# Patient Record
Sex: Female | Born: 1955 | Race: White | Hispanic: No | Smoking: Current every day smoker
Health system: Southern US, Community
[De-identification: ages and names within clinical notes are randomized; demographics above are authoritative.]

## PROBLEM LIST (undated history)

## (undated) DIAGNOSIS — R000 Tachycardia, unspecified: Secondary | ICD-10-CM

## (undated) DIAGNOSIS — C801 Malignant (primary) neoplasm, unspecified: Secondary | ICD-10-CM

## (undated) DIAGNOSIS — I10 Essential (primary) hypertension: Secondary | ICD-10-CM

## (undated) DIAGNOSIS — C569 Malignant neoplasm of unspecified ovary: Secondary | ICD-10-CM

## (undated) HISTORY — PX: ABDOMINAL HYSTERECTOMY: SHX81

---

## 2013-05-12 ENCOUNTER — Emergency Department (HOSPITAL_BASED_OUTPATIENT_CLINIC_OR_DEPARTMENT_OTHER): Payer: PRIVATE HEALTH INSURANCE

## 2013-05-12 ENCOUNTER — Observation Stay (HOSPITAL_BASED_OUTPATIENT_CLINIC_OR_DEPARTMENT_OTHER)
Admission: EM | Admit: 2013-05-12 | Discharge: 2013-05-15 | Disposition: A | Discharge status: Home / Self Care | Payer: PRIVATE HEALTH INSURANCE | Attending: Orthopedic Surgery | Admitting: Orthopedic Surgery

## 2013-05-12 ENCOUNTER — Encounter (HOSPITAL_COMMUNITY)
Admission: EM | Disposition: A | Discharge status: Home / Self Care | Payer: Self-pay | Source: Home / Self Care | Attending: Orthopedic Surgery

## 2013-05-12 ENCOUNTER — Encounter (HOSPITAL_BASED_OUTPATIENT_CLINIC_OR_DEPARTMENT_OTHER): Payer: Self-pay | Admitting: Emergency Medicine

## 2013-05-12 DIAGNOSIS — Z90710 Acquired absence of both cervix and uterus: Secondary | ICD-10-CM | POA: Insufficient documentation

## 2013-05-12 DIAGNOSIS — I10 Essential (primary) hypertension: Secondary | ICD-10-CM | POA: Insufficient documentation

## 2013-05-12 DIAGNOSIS — F17200 Nicotine dependence, unspecified, uncomplicated: Secondary | ICD-10-CM | POA: Insufficient documentation

## 2013-05-12 DIAGNOSIS — E119 Type 2 diabetes mellitus without complications: Secondary | ICD-10-CM | POA: Insufficient documentation

## 2013-05-12 DIAGNOSIS — I498 Other specified cardiac arrhythmias: Secondary | ICD-10-CM | POA: Insufficient documentation

## 2013-05-12 DIAGNOSIS — S42202A Unspecified fracture of upper end of left humerus, initial encounter for closed fracture: Secondary | ICD-10-CM | POA: Diagnosis present

## 2013-05-12 DIAGNOSIS — Z8543 Personal history of malignant neoplasm of ovary: Secondary | ICD-10-CM | POA: Insufficient documentation

## 2013-05-12 DIAGNOSIS — S0180XA Unspecified open wound of other part of head, initial encounter: Secondary | ICD-10-CM | POA: Insufficient documentation

## 2013-05-12 DIAGNOSIS — W010XXA Fall on same level from slipping, tripping and stumbling without subsequent striking against object, initial encounter: Secondary | ICD-10-CM | POA: Insufficient documentation

## 2013-05-12 DIAGNOSIS — S42213A Unspecified displaced fracture of surgical neck of unspecified humerus, initial encounter for closed fracture: Principal | ICD-10-CM | POA: Insufficient documentation

## 2013-05-12 DIAGNOSIS — E876 Hypokalemia: Secondary | ICD-10-CM | POA: Insufficient documentation

## 2013-05-12 DIAGNOSIS — S0181XA Laceration without foreign body of other part of head, initial encounter: Secondary | ICD-10-CM

## 2013-05-12 DIAGNOSIS — Y92009 Unspecified place in unspecified non-institutional (private) residence as the place of occurrence of the external cause: Secondary | ICD-10-CM | POA: Insufficient documentation

## 2013-05-12 HISTORY — DX: Tachycardia, unspecified: R00.0

## 2013-05-12 HISTORY — DX: Malignant neoplasm of unspecified ovary: C56.9

## 2013-05-12 HISTORY — DX: Malignant (primary) neoplasm, unspecified: C80.1

## 2013-05-12 HISTORY — DX: Essential (primary) hypertension: I10

## 2013-05-12 LAB — CBC WITH DIFFERENTIAL/PLATELET
Basophils Absolute: 0.1 10*3/uL (ref 0.0–0.1)
Basophils Relative: 0 % (ref 0–1)
Eosinophils Absolute: 0.1 10*3/uL (ref 0.0–0.7)
Eosinophils Relative: 0 % (ref 0–5)
HCT: 42.6 % (ref 36.0–46.0)
Hemoglobin: 14.6 g/dL (ref 12.0–15.0)
LYMPHS PCT: 11 % — AB (ref 12–46)
Lymphs Abs: 2.1 10*3/uL (ref 0.7–4.0)
MCH: 33 pg (ref 26.0–34.0)
MCHC: 34.3 g/dL (ref 30.0–36.0)
MCV: 96.2 fL (ref 78.0–100.0)
Monocytes Absolute: 0.9 10*3/uL (ref 0.1–1.0)
Monocytes Relative: 5 % (ref 3–12)
Neutro Abs: 15.3 10*3/uL — ABNORMAL HIGH (ref 1.7–7.7)
Neutrophils Relative %: 84 % — ABNORMAL HIGH (ref 43–77)
PLATELETS: 413 10*3/uL — AB (ref 150–400)
RBC: 4.43 MIL/uL (ref 3.87–5.11)
RDW: 14.5 % (ref 11.5–15.5)
WBC: 18.4 10*3/uL — AB (ref 4.0–10.5)

## 2013-05-12 LAB — BASIC METABOLIC PANEL
BUN: 10 mg/dL (ref 6–23)
CHLORIDE: 97 meq/L (ref 96–112)
CO2: 24 mEq/L (ref 19–32)
Calcium: 9.4 mg/dL (ref 8.4–10.5)
Creatinine, Ser: 0.7 mg/dL (ref 0.50–1.10)
GFR calc Af Amer: >90 mL/min (ref >90)
GFR calc non Af Amer: >90 mL/min (ref >90)
Glucose, Bld: 177 mg/dL — ABNORMAL HIGH (ref 70–99)
Potassium: 3.2 mEq/L — ABNORMAL LOW (ref 3.7–5.3)
SODIUM: 139 meq/L (ref 137–147)

## 2013-05-12 LAB — PROTIME-INR
INR: 0.91 (ref 0.00–1.49)
Prothrombin Time: 12.1 seconds (ref 11.6–15.2)

## 2013-05-12 LAB — GLUCOSE, CAPILLARY: Glucose-Capillary: 158 mg/dL — ABNORMAL HIGH (ref 70–99)

## 2013-05-12 SURGERY — OPEN REDUCTION INTERNAL FIXATION (ORIF) PROXIMAL HUMERUS FRACTURE
Anesthesia: General | Laterality: Left

## 2013-05-12 MED ORDER — ONDANSETRON HCL 4 MG PO TABS
4.0000 mg | ORAL_TABLET | Freq: Four times a day (QID) | ORAL | Status: DC | PRN
Start: 2013-05-12 — End: 2013-05-13

## 2013-05-12 MED ORDER — HYDROMORPHONE HCL PF 1 MG/ML IJ SOLN
0.5000 mg | INTRAMUSCULAR | Status: DC | PRN
  Administered 2013-05-12: 0.5 mg via INTRAVENOUS
  Filled 2013-05-12 (×3): qty 1

## 2013-05-12 MED ORDER — HYDROMORPHONE HCL PF 1 MG/ML IJ SOLN
0.5000 mg | INTRAMUSCULAR | Status: AC | PRN
  Administered 2013-05-12 (×3): 0.5 mg via INTRAVENOUS
  Filled 2013-05-12 (×3): qty 1

## 2013-05-12 MED ORDER — METHOCARBAMOL 1000 MG/10ML IJ SOLN: 500.0000 mg | Freq: Four times a day (QID) | INTRAVENOUS | Status: DC | PRN

## 2013-05-12 MED ORDER — DOCUSATE SODIUM 100 MG PO CAPS
100.0000 mg | ORAL_CAPSULE | Freq: Two times a day (BID) | ORAL | Status: DC
  Administered 2013-05-12: 100 mg via ORAL
  Filled 2013-05-12 (×3): qty 1

## 2013-05-12 MED ORDER — OXYCODONE-ACETAMINOPHEN 5-325 MG PO TABS: 1.0000 | ORAL_TABLET | ORAL | Status: DC | PRN

## 2013-05-12 MED ORDER — HYDROMORPHONE HCL PF 1 MG/ML IJ SOLN
0.5000 mg | INTRAMUSCULAR | Status: DC | PRN
  Administered 2013-05-12 – 2013-05-13 (×8): 1 mg via INTRAVENOUS
  Filled 2013-05-12 (×9): qty 1

## 2013-05-12 MED ORDER — METHOCARBAMOL 500 MG PO TABS
500.0000 mg | ORAL_TABLET | Freq: Four times a day (QID) | ORAL | Status: DC | PRN
  Administered 2013-05-13: 500 mg via ORAL

## 2013-05-12 MED ORDER — POTASSIUM CHLORIDE IN NACL 20-0.45 MEQ/L-% IV SOLN
INTRAVENOUS | Status: DC
  Administered 2013-05-12: 23:00:00 via INTRAVENOUS
  Filled 2013-05-12 (×3): qty 1000

## 2013-05-12 MED ORDER — KCL IN DEXTROSE-NACL 20-5-0.45 MEQ/L-%-% IV SOLN
Freq: Once | INTRAVENOUS | Status: AC
  Administered 2013-05-12: 18:00:00 via INTRAVENOUS
  Filled 2013-05-12: qty 1000

## 2013-05-12 MED ORDER — METOCLOPRAMIDE HCL 5 MG PO TABS: 5.0000 mg | ORAL_TABLET | Freq: Three times a day (TID) | ORAL | Status: DC | PRN

## 2013-05-12 MED ORDER — KCL IN DEXTROSE-NACL 30-5-0.45 MEQ/L-%-% IV SOLN
INTRAVENOUS | Status: DC
  Filled 2013-05-12: qty 1000

## 2013-05-12 MED ORDER — POLYETHYLENE GLYCOL 3350 17 G PO PACK
17.0000 g | PACK | Freq: Every day | ORAL | Status: DC | PRN
  Filled 2013-05-12: qty 1

## 2013-05-12 MED ORDER — DIPHENHYDRAMINE HCL 12.5 MG/5ML PO ELIX
12.5000 mg | ORAL_SOLUTION | ORAL | Status: DC | PRN
  Filled 2013-05-12: qty 10

## 2013-05-12 MED ORDER — SENNA 8.6 MG PO TABS
1.0000 | ORAL_TABLET | Freq: Two times a day (BID) | ORAL | Status: DC
  Administered 2013-05-12: 8.6 mg via ORAL
  Filled 2013-05-12 (×3): qty 1

## 2013-05-12 MED ORDER — HYDROMORPHONE HCL PF 1 MG/ML IJ SOLN: 1.0000 mg | INTRAMUSCULAR | Status: DC | PRN

## 2013-05-12 MED ORDER — HYDROMORPHONE HCL PF 1 MG/ML IJ SOLN
INTRAMUSCULAR | Status: AC
  Administered 2013-05-12: 1 mg
  Filled 2013-05-12: qty 1

## 2013-05-12 MED ORDER — PNEUMOCOCCAL VAC POLYVALENT 25 MCG/0.5ML IJ INJ
0.5000 mL | INJECTION | INTRAMUSCULAR | Status: DC
  Filled 2013-05-12: qty 0.5

## 2013-05-12 MED ORDER — ONDANSETRON HCL 4 MG/2ML IJ SOLN: 4.0000 mg | Freq: Four times a day (QID) | INTRAMUSCULAR | Status: DC | PRN

## 2013-05-12 MED ORDER — OXYCODONE HCL 5 MG PO TABS: 5.0000 mg | ORAL_TABLET | ORAL | Status: DC | PRN

## 2013-05-12 MED ORDER — BISACODYL 10 MG RE SUPP: 10.0000 mg | Freq: Every day | RECTAL | Status: DC | PRN

## 2013-05-12 MED ORDER — DEXTROSE-NACL 5-0.45 % IV SOLN: INTRAVENOUS | Status: DC

## 2013-05-12 MED ORDER — METOCLOPRAMIDE HCL 5 MG/ML IJ SOLN: 5.0000 mg | Freq: Three times a day (TID) | INTRAMUSCULAR | Status: DC | PRN

## 2013-05-12 SURGICAL SUPPLY — 42 items
BENZOIN TINCTURE PRP APPL 2/3 (GAUZE/BANDAGES/DRESSINGS) ×3 IMPLANT
BLADE 10 SAFETY STRL DISP (BLADE) ×3 IMPLANT
CLEANER TIP ELECTROSURG 2X2 (MISCELLANEOUS) IMPLANT
CLOSURE STERI-STRIP 1/2X4 (GAUZE/BANDAGES/DRESSINGS) ×1
CLSR STERI-STRIP ANTIMIC 1/2X4 (GAUZE/BANDAGES/DRESSINGS) ×2 IMPLANT
COVER SURGICAL LIGHT HANDLE (MISCELLANEOUS) ×3 IMPLANT
DRAPE C-ARM 42X72 X-RAY (DRAPES) ×3 IMPLANT
DRAPE INCISE IOBAN 66X45 STRL (DRAPES) ×3 IMPLANT
DRAPE U-SHAPE 47X51 STRL (DRAPES) ×3 IMPLANT
DRSG MEPILEX BORDER 4X8 (GAUZE/BANDAGES/DRESSINGS) ×3 IMPLANT
DURAPREP 26ML APPLICATOR (WOUND CARE) ×3 IMPLANT
ELECT REM PT RETURN 9FT ADLT (ELECTROSURGICAL)
ELECTRODE REM PT RTRN 9FT ADLT (ELECTROSURGICAL) IMPLANT
GLOVE BIOGEL PI ORTHO PRO SZ8 (GLOVE) ×2
GLOVE ORTHO TXT STRL SZ7.5 (GLOVE) ×6 IMPLANT
GLOVE PI ORTHO PRO STRL SZ8 (GLOVE) ×1 IMPLANT
GLOVE SURG ORTHO 8.0 STRL STRW (GLOVE) ×6 IMPLANT
GOWN STRL REUS W/ TWL LRG LVL3 (GOWN DISPOSABLE) IMPLANT
GOWN STRL REUS W/TWL LRG LVL3 (GOWN DISPOSABLE)
KIT BASIN OR (CUSTOM PROCEDURE TRAY) ×3 IMPLANT
KIT ROOM TURNOVER OR (KITS) ×3 IMPLANT
MANIFOLD NEPTUNE II (INSTRUMENTS) ×3 IMPLANT
NEEDLE HYPO 25GX1X1/2 BEV (NEEDLE) IMPLANT
NS IRRIG 1000ML POUR BTL (IV SOLUTION) ×3 IMPLANT
PACK SHOULDER (CUSTOM PROCEDURE TRAY) ×3 IMPLANT
PAD ARMBOARD 7.5X6 YLW CONV (MISCELLANEOUS) ×6 IMPLANT
SPONGE LAP 4X18 X RAY DECT (DISPOSABLE) IMPLANT
STAPLER VISISTAT 35W (STAPLE) IMPLANT
SUCTION FRAZIER TIP 10 FR DISP (SUCTIONS) ×3 IMPLANT
SUPPORT WRAP ARM LG (MISCELLANEOUS) ×3 IMPLANT
SUT FIBERWIRE #2 38 T-5 BLUE (SUTURE)
SUT MNCRL AB 4-0 PS2 18 (SUTURE) ×3 IMPLANT
SUT VIC AB 0 CTB1 27 (SUTURE) IMPLANT
SUT VIC AB 2-0 CT1 27 (SUTURE) ×2
SUT VIC AB 2-0 CT1 TAPERPNT 27 (SUTURE) ×1 IMPLANT
SUT VIC AB 3-0 FS2 27 (SUTURE) ×3 IMPLANT
SUTURE FIBERWR #2 38 T-5 BLUE (SUTURE) IMPLANT
SYR BULB IRRIGATION 50ML (SYRINGE) ×3 IMPLANT
SYR CONTROL 10ML LL (SYRINGE) IMPLANT
TOWEL OR 17X24 6PK STRL BLUE (TOWEL DISPOSABLE) ×3 IMPLANT
TOWEL OR 17X26 10 PK STRL BLUE (TOWEL DISPOSABLE) ×3 IMPLANT
WATER STERILE IRR 1000ML POUR (IV SOLUTION) ×3 IMPLANT

## 2013-05-12 NOTE — ED Notes (Signed)
MD at bedside. 

## 2013-05-12 NOTE — ED Notes (Addendum)
Door caught the bottom of her shoe causing her to fall. Laceration above her left eye and forehead. Pain in her right knee. Skin tear to her left forearm. No other complaints.

## 2013-05-12 NOTE — H&P (Signed)
PREOPERATIVE H&P  Chief Complaint: Left shoulder pain HPI: Alison Neal is a 58 y.o. female who tripped after catching her foot at the bottom of the door today, causing her to fall.  She had acute onset severe pain of the left shoulder, unable to lift her arm, she also a laceration of her high she [facility], and then referred to me for urgent followup due to the severe displacement of the proximal humerus fracture. She denies other injuries in the fall, with the exception of an abrasion over the right knee. She is normally able to function well with the left upper extremity, and has not had osteoporosis that she knows of. She does currently smoke, and I recommended that she quit. She also has a family history of diabetes and cardiac disease.  Past Medical History  Diagnosis Date  . Hypertension   . Tachycardia   . Cancer   . Ovarian cancer    Past Surgical History  Procedure Laterality Date  . Abdominal hysterectomy     History   Social History  . Marital Status: Widowed    Spouse Name: N/A    Number of Children: N/A  . Years of Education: N/A   Social History Main Topics  . Smoking status: Current Every Day Smoker -- 1.50 packs/day    Types: Cigarettes  . Smokeless tobacco: None  . Alcohol Use: No  . Drug Use: No  . Sexual Activity: None   Other Topics Concern  . None   Social History Narrative  . None   History reviewed. No pertinent family history. Allergies  Allergen Reactions  . Iodine     hives   Prior to Admission medications   Medication Sig Start Date End Date Taking? Authorizing Provider  AmLODIPine Besylate (NORVASC PO) Take by mouth.   Yes Historical Provider, MD  Gabapentin (NEURONTIN PO) Take by mouth.   Yes Historical Provider, MD  Lisinopril (ZESTRIL PO) Take by mouth.   Yes Historical Provider, MD  METFORMIN HCL PO Take by mouth.   Yes Historical Provider, MD  Metoprolol Succinate (TOPROL XL PO) Take  by mouth.   Yes Historical Provider, MD  OMEPRAZOLE PO Take by mouth.   Yes Historical Provider, MD  Venlafaxine HCl (EFFEXOR XR PO) Take by mouth.   Yes Historical Provider, MD     Positive ROS: All other systems have been reviewed and were otherwise negative with the exception of those mentioned in the HPI and as above.  Physical Exam: General: Alert, no acute distress Cardiovascular: No pedal edema Respiratory: No cyanosis, no use of accessory musculature GI: No organomegaly, abdomen is soft and non-tender Skin: No lesions in the area of chief complaint, although she does have a laceration over the eye that is clean. Neurologic: Sensation intact distally Psychiatric: Patient is competent for consent with normal mood and affect Lymphatic: No axillary or cervical lymphadenopathy  MUSCULOSKELETAL: Left hand has sensation intact throughout, all fingers flex extend and abduct. She has sensation intact over the deltoid.  Assessment: Left displaced proximal humerus fracture, with significant displacement, and tobacco abuse, history of tachycardia.  Plan: Plan for Procedure(s): OPEN REDUCTION INTERNAL FIXATION (ORIF) PROXIMAL HUMERUS FRACTURE  The risks benefits and alternatives were discussed with the patient including but not limited to the risks of nonoperative treatment, versus surgical intervention including infection, bleeding, nerve injury, malunion, nonunion, the need for revision surgery, hardware prominence, hardware failure, the need for hardware removal, blood clots, cardiopulmonary  complications, morbidity, mortality, among others, and they were willing to proceed.    We were planning on surgical intervention as soon as possible, however unfortunately there are no operating room staff available until well after midnight tonight, and therefore we will plan for surgical intervention as soon as it can be appropriately scheduled, likely tomorrow. I was trying to get her in as soon as  possible in order to minimize the risk for avascular necrosis given the severe displacement of the head from the shaft segment.  In the meantime, we will allow her to eat tonight, make her n.p.o. after midnight, and plan for surgery tomorrow. I have discussed the options of internal fixation which will be our primary plan, versus arthroplasty which we will reserve as backup.  Johnny Bridge, MD Cell [phone]   05/12/2013 9:17 PM

## 2013-05-12 NOTE — ED Provider Notes (Signed)
CSN: [id number]     Arrival date & time 05/12/13  1449 History   First MD Initiated Contact with Patient 05/12/13 1512     Chief Complaint  Patient presents with  . Fall   HPI Comments: Pt was walking out of her door when the door caught the back of her shoe.  She lost her balance and fell to the ground.  She has 6/10 pain in her left upper arm now and does not feel that she can move it without severe pain.  She also hit her head and sustained a small laceration above her left eye.  No LOC.  NO headache.  No trouble or pain with walking.  Patient is a 58 y.o. female presenting with fall. The history is provided by the patient.  Fall This is a new problem. Episode onset: today. The problem occurs constantly. The problem has not changed since onset.Pertinent negatives include no chest pain, no abdominal pain and no headaches. Exacerbated by: movement. Nothing relieves the symptoms. She has tried nothing for the symptoms.    Past Medical History  Diagnosis Date  . Hypertension   . Tachycardia   . Cancer   . Ovarian cancer    Past Surgical History  Procedure Laterality Date  . Abdominal hysterectomy     No family history on file. History  Substance Use Topics  . Smoking status: Current Every Day Smoker -- 1.50 packs/day    Types: Cigarettes  . Smokeless tobacco: Not on file  . Alcohol Use: No   OB History   Grav Para Term Preterm Abortions TAB SAB Ect Mult Living                 Review of Systems  Cardiovascular: Negative for chest pain.  Gastrointestinal: Negative for abdominal pain.  Neurological: Negative for headaches.  All other systems reviewed and are negative.     Allergies  Iodine  Home Medications   Prior to Admission medications   Medication Sig Start Date End Date Taking? Authorizing Provider  AmLODIPine Besylate (NORVASC PO) Take by mouth.   Yes Historical Provider, MD  Gabapentin (NEURONTIN PO) Take by mouth.   Yes Historical Provider, MD  Lisinopril  (ZESTRIL PO) Take by mouth.   Yes Historical Provider, MD  METFORMIN HCL PO Take by mouth.   Yes Historical Provider, MD  Metoprolol Succinate (TOPROL XL PO) Take by mouth.   Yes Historical Provider, MD  OMEPRAZOLE PO Take by mouth.   Yes Historical Provider, MD  Venlafaxine HCl (EFFEXOR XR PO) Take by mouth.   Yes Historical Provider, MD   BP 139/75  Pulse 85  Temp(Src) 98.1 F (36.7 C) (Oral)  Resp 20  Ht 5\' 7"  (1.702 m)  Wt 175 lb (79.379 kg)  BMI 27.40 kg/m2  SpO2 97% Physical Exam  Nursing note and vitals reviewed. Constitutional: She appears well-developed and well-nourished. No distress.  HENT:  Head: Normocephalic.  Right Ear: External ear normal.  Left Ear: External ear normal.  Small laceration above left eye  Eyes: Conjunctivae are normal. Right eye exhibits no discharge. Left eye exhibits no discharge. No scleral icterus.  Neck: Neck supple. No tracheal deviation present.  Cardiovascular: Normal rate, regular rhythm and intact distal pulses.   Pulmonary/Chest: Effort normal and breath sounds normal. No stridor. No respiratory distress. She has no wheezes. She has no rales.  Abdominal: Soft. Bowel sounds are normal. She exhibits no distension. There is no tenderness. There is no rebound and no guarding.  Musculoskeletal: She exhibits no edema.       Left shoulder: She exhibits tenderness, deformity and pain. She exhibits normal range of motion.       Cervical back: Normal.       Thoracic back: Normal.       Lumbar back: Normal.       Left upper arm: She exhibits tenderness and bony tenderness.  Neurological: She is alert. She has normal strength. No cranial nerve deficit (no facial droop, extraocular movements intact, no slurred speech) or sensory deficit. She exhibits normal muscle tone. She displays no seizure activity. Coordination normal.  Skin: Skin is warm and dry. No rash noted.  Psychiatric: She has a normal mood and affect.    ED Course  LACERATION  REPAIR Date/Time: 05/12/2013 4:50 PM Performed by: Dorie Rank R Authorized by: Dorie Rank R Consent: Verbal consent obtained. Risks and benefits: risks, benefits and alternatives were discussed Body area: head/neck Location details: left eyebrow Laceration length: 1 cm Foreign bodies: no foreign bodies Tendon involvement: none Nerve involvement: none Vascular damage: no Irrigation solution: saline Amount of cleaning: standard Debridement: none Degree of undermining: none Skin closure: glue Patient tolerance: Patient tolerated the procedure well with no immediate complications.   (including critical care time) Labs Review Labs Reviewed  CBC WITH DIFFERENTIAL - Abnormal; Notable for the following:    WBC 18.4 (*)    Platelets 413 (*)    Neutrophils Relative % 84 (*)    Neutro Abs 15.3 (*)    Lymphocytes Relative 11 (*)    All other components within normal limits  BASIC METABOLIC PANEL - Abnormal; Notable for the following:    Potassium 3.2 (*)    Glucose, Bld 177 (*)    All other components within normal limits  PROTIME-INR    Imaging Review Dg Chest Portable 1 View  05/12/2013   CLINICAL DATA:  Preoperative chest x-ray  EXAM: PORTABLE CHEST - 1 VIEW  COMPARISON:  None.  FINDINGS: Cardiac and mediastinal contours are within normal limits. Mild pulmonary vascular congestion without overt edema. Elevation of the left hemidiaphragm. No pneumothorax, pleural effusion or focal airspace consolidation. No suspicious pulmonary nodule or mass. Displaced fracture through the surgical neck of the left humerus.  IMPRESSION: 1. Pulmonary vascular congestion without overt edema. 2. Displaced fracture through the surgical neck of the left humerus.   Electronically Signed   By: Jacqulynn Cadet M.D.   On: 05/12/2013 16:52   Dg Shoulder Left  05/12/2013   CLINICAL DATA:  Fall, left arm pain  EXAM: LEFT SHOULDER - 2+ VIEW  COMPARISON:  None.  FINDINGS: There is a fractures to the surgical neck  of the humerus. This is a comminuted fracture with multiple fragments a primary of which represent the humeral head and shaft, with fracture extending into the greater tuberosity of the humerus and multiple smaller associated fragments. Humeral head remains in anatomic position while the shaft of the humerus is displaced completely medially and anteriorly relative to the humeral head.  IMPRESSION: Significantly displaced and comminuted fracture of the humerus.   Electronically Signed   By: Skipper Cliche M.D.   On: 05/12/2013 16:01   Dg Humerus Left  05/12/2013   CLINICAL DATA:  Fall, left arm pain  EXAM: LEFT HUMERUS - 2+ VIEW  COMPARISON:  None.  FINDINGS: There is a significantly displaced and comminuted fractures to the surgical neck of the humerus. The shaft of the humerus it is completely displaced medially relative to  the humeral head.  IMPRESSION: Comminuted surgical neck humerus fracture with significant displacement.   Electronically Signed   By: Skipper Cliche M.D.   On: 05/12/2013 16:00     EKG Interpretation  Date/Time:  Tuesday May 12 2013 16:47:23 EDT Ventricular Rate:  73 PR Interval:  168 QRS Duration: 88 QT Interval:  434 QTC Calculation: 478 R Axis:   18 Text Interpretation:  Normal sinus rhythm Normal ECG No previous tracing Confirmed by Elsie Baynes  MD-J, Lashala Laser ([id number]) on 05/12/2013 5:30:10 PM       MDM   Final diagnoses:  Humeral surgical neck fracture  Facial laceration    Surgical neck fracture has a significant amount of displacement.  I will contact orthopedics on call for further recommendations.  Discussed with Dr Mardelle Matte.  Last PO was 1030am.   Pt will be transferred to [facility].  Anticipate operative repair this evening.  Pre op labs ordered.    Kathalene Frames, MD 05/12/13 [phone]

## 2013-05-13 ENCOUNTER — Observation Stay (HOSPITAL_COMMUNITY): Payer: PRIVATE HEALTH INSURANCE

## 2013-05-13 ENCOUNTER — Encounter (HOSPITAL_COMMUNITY)
Admission: EM | Disposition: A | Discharge status: Home / Self Care | Payer: Self-pay | Source: Home / Self Care | Attending: Orthopedic Surgery

## 2013-05-13 ENCOUNTER — Encounter (HOSPITAL_COMMUNITY): Payer: Self-pay | Admitting: Anesthesiology

## 2013-05-13 ENCOUNTER — Encounter (HOSPITAL_COMMUNITY): Payer: PRIVATE HEALTH INSURANCE | Admitting: Anesthesiology

## 2013-05-13 ENCOUNTER — Observation Stay (HOSPITAL_COMMUNITY): Payer: PRIVATE HEALTH INSURANCE | Admitting: Anesthesiology

## 2013-05-13 HISTORY — PX: ORIF HUMERUS FRACTURE: SHX2126

## 2013-05-13 LAB — GLUCOSE, CAPILLARY
GLUCOSE-CAPILLARY: 160 mg/dL — AB (ref 70–99)
GLUCOSE-CAPILLARY: 135 mg/dL — AB (ref 70–99)
GLUCOSE-CAPILLARY: 172 mg/dL — AB (ref 70–99)

## 2013-05-13 LAB — SURGICAL PCR SCREEN
MRSA, PCR: NEGATIVE
STAPHYLOCOCCUS AUREUS: POSITIVE — AB

## 2013-05-13 SURGERY — OPEN REDUCTION INTERNAL FIXATION (ORIF) PROXIMAL HUMERUS FRACTURE
Anesthesia: General | Site: Shoulder | Laterality: Left

## 2013-05-13 MED ORDER — ROCURONIUM BROMIDE 100 MG/10ML IV SOLN
INTRAVENOUS | Status: DC | PRN
  Administered 2013-05-13: 50 mg via INTRAVENOUS

## 2013-05-13 MED ORDER — ONDANSETRON HCL 4 MG PO TABS
4.0000 mg | ORAL_TABLET | Freq: Four times a day (QID) | ORAL | Status: DC | PRN
  Administered 2013-05-14: 4 mg via ORAL
  Filled 2013-05-13: qty 1

## 2013-05-13 MED ORDER — PHENYLEPHRINE HCL 10 MG/ML IJ SOLN
10.0000 mg | INTRAMUSCULAR | Status: DC | PRN
  Administered 2013-05-13: 50 ug/min via INTRAVENOUS

## 2013-05-13 MED ORDER — PHENOL 1.4 % MT LIQD: 1.0000 | OROMUCOSAL | Status: DC | PRN

## 2013-05-13 MED ORDER — ONDANSETRON HCL 4 MG/2ML IJ SOLN: 4.0000 mg | Freq: Four times a day (QID) | INTRAMUSCULAR | Status: DC | PRN

## 2013-05-13 MED ORDER — MIDAZOLAM HCL 2 MG/2ML IJ SOLN: 1.0000 mg | Freq: Once | INTRAMUSCULAR | Status: DC

## 2013-05-13 MED ORDER — OXYCODONE-ACETAMINOPHEN 10-325 MG PO TABS: 1.0000 | ORAL_TABLET | Freq: Four times a day (QID) | ORAL | Status: AC | PRN

## 2013-05-13 MED ORDER — METHOCARBAMOL 500 MG PO TABS
500.0000 mg | ORAL_TABLET | Freq: Four times a day (QID) | ORAL | Status: DC | PRN
  Administered 2013-05-14 – 2013-05-15 (×4): 500 mg via ORAL
  Filled 2013-05-13 (×4): qty 1

## 2013-05-13 MED ORDER — ALBUTEROL SULFATE (2.5 MG/3ML) 0.083% IN NEBU: 2.5000 mg | INHALATION_SOLUTION | Freq: Four times a day (QID) | RESPIRATORY_TRACT | Status: DC | PRN

## 2013-05-13 MED ORDER — LINAGLIPTIN 5 MG PO TABS
5.0000 mg | ORAL_TABLET | Freq: Every day | ORAL | Status: DC
  Administered 2013-05-14 – 2013-05-15 (×2): 5 mg via ORAL
  Filled 2013-05-13 (×2): qty 1

## 2013-05-13 MED ORDER — ALUM & MAG HYDROXIDE-SIMETH 200-200-20 MG/5ML PO SUSP: 30.0000 mL | ORAL | Status: DC | PRN

## 2013-05-13 MED ORDER — FENTANYL CITRATE 0.05 MG/ML IJ SOLN
INTRAMUSCULAR | Status: AC
  Filled 2013-05-13: qty 5

## 2013-05-13 MED ORDER — POTASSIUM CHLORIDE IN NACL 20-0.45 MEQ/L-% IV SOLN
INTRAVENOUS | Status: DC
  Administered 2013-05-13 – 2013-05-14 (×2): via INTRAVENOUS
  Filled 2013-05-13 (×5): qty 1000

## 2013-05-13 MED ORDER — LACTATED RINGERS IV SOLN
INTRAVENOUS | Status: DC | PRN
  Administered 2013-05-13: 17:00:00 via INTRAVENOUS

## 2013-05-13 MED ORDER — POLYETHYLENE GLYCOL 3350 17 G PO PACK
17.0000 g | PACK | Freq: Every day | ORAL | Status: DC | PRN
  Filled 2013-05-13: qty 1

## 2013-05-13 MED ORDER — NEOSTIGMINE METHYLSULFATE 10 MG/10ML IV SOLN
INTRAVENOUS | Status: AC
  Filled 2013-05-13: qty 1

## 2013-05-13 MED ORDER — DOCUSATE SODIUM 100 MG PO CAPS
100.0000 mg | ORAL_CAPSULE | Freq: Two times a day (BID) | ORAL | Status: DC
  Administered 2013-05-13 – 2013-05-15 (×4): 100 mg via ORAL
  Filled 2013-05-13 (×5): qty 1

## 2013-05-13 MED ORDER — OXYCODONE-ACETAMINOPHEN 5-325 MG PO TABS
1.0000 | ORAL_TABLET | ORAL | Status: DC | PRN
  Administered 2013-05-14 (×2): 1 via ORAL
  Administered 2013-05-14: 2 via ORAL
  Filled 2013-05-13: qty 2
  Filled 2013-05-13 (×2): qty 1

## 2013-05-13 MED ORDER — AMLODIPINE BESYLATE 10 MG PO TABS
10.0000 mg | ORAL_TABLET | Freq: Every day | ORAL | Status: DC
  Administered 2013-05-13 – 2013-05-15 (×3): 10 mg via ORAL
  Filled 2013-05-13 (×3): qty 1

## 2013-05-13 MED ORDER — GLYCOPYRROLATE 0.2 MG/ML IJ SOLN
INTRAMUSCULAR | Status: AC
  Filled 2013-05-13: qty 3

## 2013-05-13 MED ORDER — METFORMIN HCL 500 MG PO TABS
1000.0000 mg | ORAL_TABLET | Freq: Two times a day (BID) | ORAL | Status: DC
  Administered 2013-05-14 – 2013-05-15 (×3): 1000 mg via ORAL
  Filled 2013-05-13 (×5): qty 2

## 2013-05-13 MED ORDER — LIDOCAINE HCL (CARDIAC) 20 MG/ML IV SOLN
INTRAVENOUS | Status: DC | PRN
  Administered 2013-05-13: 100 mg via INTRAVENOUS

## 2013-05-13 MED ORDER — METOPROLOL SUCCINATE ER 25 MG PO TB24
25.0000 mg | ORAL_TABLET | Freq: Every day | ORAL | Status: DC
  Administered 2013-05-13 – 2013-05-15 (×3): 25 mg via ORAL
  Filled 2013-05-13 (×4): qty 1

## 2013-05-13 MED ORDER — GLYCOPYRROLATE 0.2 MG/ML IJ SOLN
INTRAMUSCULAR | Status: DC | PRN
Start: 2013-05-13 — End: 2013-05-13
  Administered 2013-05-13: 0.4 mg via INTRAVENOUS

## 2013-05-13 MED ORDER — PROMETHAZINE HCL 25 MG PO TABS: 25.0000 mg | ORAL_TABLET | Freq: Four times a day (QID) | ORAL | Status: AC | PRN

## 2013-05-13 MED ORDER — BUPIVACAINE-EPINEPHRINE (PF) 0.5% -1:200000 IJ SOLN
INTRAMUSCULAR | Status: DC | PRN
  Administered 2013-05-13: 30 mL

## 2013-05-13 MED ORDER — METHOCARBAMOL 500 MG PO TABS: 500.0000 mg | ORAL_TABLET | Freq: Four times a day (QID) | ORAL | Status: AC

## 2013-05-13 MED ORDER — HYDROMORPHONE HCL PF 1 MG/ML IJ SOLN
0.5000 mg | INTRAMUSCULAR | Status: DC | PRN
  Administered 2013-05-14 (×4): 1 mg via INTRAVENOUS
  Filled 2013-05-13 (×4): qty 1

## 2013-05-13 MED ORDER — VENLAFAXINE HCL ER 75 MG PO CP24
225.0000 mg | ORAL_CAPSULE | Freq: Every day | ORAL | Status: DC
  Administered 2013-05-14 – 2013-05-15 (×2): 225 mg via ORAL
  Filled 2013-05-13 (×3): qty 1

## 2013-05-13 MED ORDER — EPHEDRINE SULFATE 50 MG/ML IJ SOLN
INTRAMUSCULAR | Status: DC | PRN
  Administered 2013-05-13 (×2): 10 mg via INTRAVENOUS

## 2013-05-13 MED ORDER — METOCLOPRAMIDE HCL 5 MG/ML IJ SOLN: 5.0000 mg | Freq: Three times a day (TID) | INTRAMUSCULAR | Status: DC | PRN

## 2013-05-13 MED ORDER — METHOCARBAMOL 500 MG PO TABS
ORAL_TABLET | ORAL | Status: AC
  Filled 2013-05-13: qty 1

## 2013-05-13 MED ORDER — 0.9 % SODIUM CHLORIDE (POUR BTL) OPTIME
TOPICAL | Status: DC | PRN
  Administered 2013-05-13: 1000 mL

## 2013-05-13 MED ORDER — MIDAZOLAM HCL 2 MG/2ML IJ SOLN
INTRAMUSCULAR | Status: AC
  Filled 2013-05-13: qty 2

## 2013-05-13 MED ORDER — NEOSTIGMINE METHYLSULFATE 10 MG/10ML IV SOLN
INTRAVENOUS | Status: DC | PRN
  Administered 2013-05-13: 3 mg via INTRAVENOUS

## 2013-05-13 MED ORDER — FLUTICASONE PROPIONATE 50 MCG/ACT NA SUSP
2.0000 | Freq: Every day | NASAL | Status: DC
  Administered 2013-05-13 – 2013-05-15 (×3): 2 via NASAL
  Filled 2013-05-13: qty 16

## 2013-05-13 MED ORDER — MENTHOL 3 MG MT LOZG: 1.0000 | LOZENGE | OROMUCOSAL | Status: DC | PRN

## 2013-05-13 MED ORDER — ONDANSETRON HCL 4 MG/2ML IJ SOLN
INTRAMUSCULAR | Status: DC | PRN
  Administered 2013-05-13: 4 mg via INTRAVENOUS

## 2013-05-13 MED ORDER — DIPHENHYDRAMINE HCL 12.5 MG/5ML PO ELIX
12.5000 mg | ORAL_SOLUTION | ORAL | Status: DC | PRN
  Filled 2013-05-13: qty 10

## 2013-05-13 MED ORDER — ACETAMINOPHEN 325 MG PO TABS
650.0000 mg | ORAL_TABLET | Freq: Four times a day (QID) | ORAL | Status: DC | PRN
  Administered 2013-05-15: 650 mg via ORAL
  Filled 2013-05-13: qty 2

## 2013-05-13 MED ORDER — FENTANYL CITRATE 0.05 MG/ML IJ SOLN
INTRAMUSCULAR | Status: DC | PRN
  Administered 2013-05-13 (×2): 25 ug via INTRAVENOUS
  Administered 2013-05-13: 150 ug via INTRAVENOUS
  Administered 2013-05-13 (×2): 25 ug via INTRAVENOUS

## 2013-05-13 MED ORDER — PROPOFOL 10 MG/ML IV BOLUS
INTRAVENOUS | Status: DC | PRN
  Administered 2013-05-13: 100 mg via INTRAVENOUS

## 2013-05-13 MED ORDER — OXYCODONE HCL 5 MG PO TABS
5.0000 mg | ORAL_TABLET | ORAL | Status: DC | PRN
  Administered 2013-05-14: 5 mg via ORAL
  Administered 2013-05-14 – 2013-05-15 (×4): 10 mg via ORAL
  Filled 2013-05-13: qty 2
  Filled 2013-05-13: qty 1
  Filled 2013-05-13 (×3): qty 2

## 2013-05-13 MED ORDER — ACETAMINOPHEN 650 MG RE SUPP: 650.0000 mg | Freq: Four times a day (QID) | RECTAL | Status: DC | PRN

## 2013-05-13 MED ORDER — METOCLOPRAMIDE HCL 5 MG PO TABS: 5.0000 mg | ORAL_TABLET | Freq: Three times a day (TID) | ORAL | Status: DC | PRN

## 2013-05-13 MED ORDER — BISACODYL 10 MG RE SUPP: 10.0000 mg | Freq: Every day | RECTAL | Status: DC | PRN

## 2013-05-13 MED ORDER — LISINOPRIL 20 MG PO TABS
20.0000 mg | ORAL_TABLET | Freq: Every day | ORAL | Status: DC
  Administered 2013-05-13 – 2013-05-15 (×3): 20 mg via ORAL
  Filled 2013-05-13 (×4): qty 1

## 2013-05-13 MED ORDER — SAXAGLIPTIN-METFORMIN ER 2.5-1000 MG PO TB24: 1.0000 | ORAL_TABLET | Freq: Two times a day (BID) | ORAL | Status: DC

## 2013-05-13 MED ORDER — ARTIFICIAL TEARS OP OINT
TOPICAL_OINTMENT | OPHTHALMIC | Status: DC | PRN
  Administered 2013-05-13: 1 via OPHTHALMIC

## 2013-05-13 MED ORDER — METHOCARBAMOL 1000 MG/10ML IJ SOLN: 500.0000 mg | Freq: Four times a day (QID) | INTRAVENOUS | Status: DC | PRN

## 2013-05-13 MED ORDER — ONDANSETRON HCL 4 MG/2ML IJ SOLN
INTRAMUSCULAR | Status: AC
  Filled 2013-05-13: qty 2

## 2013-05-13 MED ORDER — CEFAZOLIN SODIUM 1-5 GM-% IV SOLN
1.0000 g | Freq: Four times a day (QID) | INTRAVENOUS | Status: AC
  Administered 2013-05-13 – 2013-05-14 (×3): 1 g via INTRAVENOUS
  Filled 2013-05-13 (×3): qty 50

## 2013-05-13 MED ORDER — CEFAZOLIN SODIUM-DEXTROSE 2-3 GM-% IV SOLR
2.0000 g | INTRAVENOUS | Status: AC
  Administered 2013-05-13: 2 g via INTRAVENOUS
  Filled 2013-05-13: qty 50

## 2013-05-13 MED ORDER — FENTANYL CITRATE 0.05 MG/ML IJ SOLN
INTRAMUSCULAR | Status: AC
  Filled 2013-05-13: qty 2

## 2013-05-13 MED ORDER — MIDAZOLAM HCL 2 MG/2ML IJ SOLN
INTRAMUSCULAR | Status: AC
  Administered 2013-05-13: 1 mg
  Filled 2013-05-13: qty 2

## 2013-05-13 MED ORDER — PANTOPRAZOLE SODIUM 40 MG PO TBEC
40.0000 mg | DELAYED_RELEASE_TABLET | Freq: Every day | ORAL | Status: DC
  Administered 2013-05-13 – 2013-05-15 (×3): 40 mg via ORAL
  Filled 2013-05-13 (×3): qty 1

## 2013-05-13 MED ORDER — SENNA 8.6 MG PO TABS
1.0000 | ORAL_TABLET | Freq: Two times a day (BID) | ORAL | Status: DC
  Administered 2013-05-13 – 2013-05-15 (×4): 8.6 mg via ORAL
  Filled 2013-05-13 (×5): qty 1

## 2013-05-13 MED ORDER — SENNA-DOCUSATE SODIUM 8.6-50 MG PO TABS: 2.0000 | ORAL_TABLET | Freq: Every day | ORAL | Status: AC

## 2013-05-13 MED ORDER — PROPOFOL 10 MG/ML IV BOLUS
INTRAVENOUS | Status: AC
  Filled 2013-05-13: qty 20

## 2013-05-13 MED ORDER — CEFAZOLIN SODIUM-DEXTROSE 2-3 GM-% IV SOLR
INTRAVENOUS | Status: AC
  Filled 2013-05-13: qty 100

## 2013-05-13 MED ORDER — GABAPENTIN 300 MG PO CAPS
600.0000 mg | ORAL_CAPSULE | ORAL | Status: DC
  Administered 2013-05-14 – 2013-05-15 (×2): 600 mg via ORAL
  Filled 2013-05-13 (×3): qty 2

## 2013-05-13 MED ORDER — ALBUTEROL SULFATE HFA 108 (90 BASE) MCG/ACT IN AERS: 2.0000 | INHALATION_SPRAY | Freq: Four times a day (QID) | RESPIRATORY_TRACT | Status: DC | PRN

## 2013-05-13 SURGICAL SUPPLY — 57 items
BENZOIN TINCTURE PRP APPL 2/3 (GAUZE/BANDAGES/DRESSINGS) ×3 IMPLANT
BIT DRILL 4 LONG FAST STEP (BIT) ×3 IMPLANT
BIT DRILL 4 SHORT FAST STEP (BIT) ×3 IMPLANT
BLADE 10 SAFETY STRL DISP (BLADE) ×3 IMPLANT
CLEANER TIP ELECTROSURG 2X2 (MISCELLANEOUS) IMPLANT
CLOSURE STERI-STRIP 1/2X4 (GAUZE/BANDAGES/DRESSINGS) ×1
CLSR STERI-STRIP ANTIMIC 1/2X4 (GAUZE/BANDAGES/DRESSINGS) ×2 IMPLANT
COVER SURGICAL LIGHT HANDLE (MISCELLANEOUS) ×3 IMPLANT
DRAPE C-ARM 42X72 X-RAY (DRAPES) ×3 IMPLANT
DRAPE INCISE IOBAN 66X45 STRL (DRAPES) ×3 IMPLANT
DRAPE U-SHAPE 47X51 STRL (DRAPES) ×3 IMPLANT
DRILL BIT 2.8MM DB28 (MISCELLANEOUS) ×3 IMPLANT
DRSG MEPILEX BORDER 4X8 (GAUZE/BANDAGES/DRESSINGS) ×3 IMPLANT
DURAPREP 26ML APPLICATOR (WOUND CARE) ×3 IMPLANT
ELECT REM PT RETURN 9FT ADLT (ELECTROSURGICAL)
ELECTRODE REM PT RTRN 9FT ADLT (ELECTROSURGICAL) IMPLANT
GLOVE BIOGEL PI ORTHO PRO SZ8 (GLOVE) ×2
GLOVE ORTHO TXT STRL SZ7.5 (GLOVE) ×6 IMPLANT
GLOVE PI ORTHO PRO STRL SZ8 (GLOVE) ×1 IMPLANT
GLOVE SURG ORTHO 8.0 STRL STRW (GLOVE) ×6 IMPLANT
GOWN STRL REUS W/ TWL LRG LVL3 (GOWN DISPOSABLE) IMPLANT
GOWN STRL REUS W/TWL LRG LVL3 (GOWN DISPOSABLE)
KIT BASIN OR (CUSTOM PROCEDURE TRAY) ×3 IMPLANT
KIT ROOM TURNOVER OR (KITS) ×3 IMPLANT
MANIFOLD NEPTUNE II (INSTRUMENTS) ×3 IMPLANT
NEEDLE 1/2 CIR CATGUT .05X1.09 (NEEDLE) ×3 IMPLANT
NEEDLE HYPO 25GX1X1/2 BEV (NEEDLE) IMPLANT
NS IRRIG 1000ML POUR BTL (IV SOLUTION) ×3 IMPLANT
PACK SHOULDER (CUSTOM PROCEDURE TRAY) ×3 IMPLANT
PAD ARMBOARD 7.5X6 YLW CONV (MISCELLANEOUS) ×6 IMPLANT
PEG STND 4.0X25.0MM (Orthopedic Implant) ×3 IMPLANT
PEG STND 4.0X35MM (Orthopedic Implant) ×3 IMPLANT
PEG STND 4.0X37.5MM (Orthopedic Implant) ×9 IMPLANT
PEG STND 4.0X50.0MM (Orthopedic Implant) ×3 IMPLANT
PEGSTD 4.0X35MM (Orthopedic Implant) ×1 IMPLANT
PEGSTD 4.0X37.5MM (Orthopedic Implant) ×3 IMPLANT
PEGSTD 4.0X50.0MM (Orthopedic Implant) ×1 IMPLANT
PIN GUIDE SHOULDER 2.0MM (PIN) ×6 IMPLANT
PLATE SHOULDER S3 4HOLE LT (Plate) ×3 IMPLANT
SCREW LOCK 90D ANGLED 3.8X26 (Screw) ×3 IMPLANT
SCREW MULTIDIR SS 3.8X28 HUMRL (Screw) ×6 IMPLANT
SPONGE LAP 4X18 X RAY DECT (DISPOSABLE) IMPLANT
STAPLER VISISTAT 35W (STAPLE) IMPLANT
SUCTION FRAZIER TIP 10 FR DISP (SUCTIONS) ×3 IMPLANT
SUPPORT WRAP ARM LG (MISCELLANEOUS) ×3 IMPLANT
SUT FIBERWIRE #2 38 T-5 BLUE (SUTURE)
SUT MNCRL AB 4-0 PS2 18 (SUTURE) ×3 IMPLANT
SUT VIC AB 0 CTB1 27 (SUTURE) IMPLANT
SUT VIC AB 2-0 CT1 27 (SUTURE) ×2
SUT VIC AB 2-0 CT1 TAPERPNT 27 (SUTURE) ×1 IMPLANT
SUT VIC AB 3-0 FS2 27 (SUTURE) ×3 IMPLANT
SUTURE FIBERWR #2 38 T-5 BLUE (SUTURE) IMPLANT
SYR BULB IRRIGATION 50ML (SYRINGE) ×3 IMPLANT
SYR CONTROL 10ML LL (SYRINGE) IMPLANT
TOWEL OR 17X24 6PK STRL BLUE (TOWEL DISPOSABLE) ×3 IMPLANT
TOWEL OR 17X26 10 PK STRL BLUE (TOWEL DISPOSABLE) ×3 IMPLANT
WATER STERILE IRR 1000ML POUR (IV SOLUTION) ×3 IMPLANT

## 2013-05-13 NOTE — Anesthesia Procedure Notes (Addendum)
Anesthesia Regional Block:  Interscalene brachial plexus block  Pre-Anesthetic Checklist: ,, timeout performed, Correct Patient, Correct Site, Correct Laterality, Correct Procedure, Correct Position, site marked, Risks and benefits discussed,  Surgical consent,  Pre-op evaluation,  At surgeon's request and post-op pain management  Laterality: Left  Prep: chloraprep       Needles:  Injection technique: Single-shot  Needle Type: Echogenic Stimulator Needle     Needle Length: 9cm 9 cm Needle Gauge: 21 and 21 G    Additional Needles:  Procedures: ultrasound guided (picture in chart) and nerve stimulator Interscalene brachial plexus block  Nerve Stimulator or Paresthesia:  Response: 0.4 mA,   Additional Responses:   Narrative:  Start time: 05/13/2013 5:05 PM End time: 05/13/2013 5:15 PM Injection made incrementally with aspirations every 5 mL. Anesthesiologist: Lillia Abed MD  Additional Notes: Monitors applied. Patient sedated. Sterile prep and drape,hand hygiene and sterile gloves were used. Relevant anatomy identified.Needle position confirmed.Local anesthetic injected incrementally after negative aspiration. Local anesthetic spread visualized around nerve(s). Vascular puncture avoided. No complications. Image printed for medical record.The patient tolerated the procedure well.

## 2013-05-13 NOTE — Progress Notes (Signed)
Dentures given to sister per patients request

## 2013-05-13 NOTE — Progress Notes (Signed)
The patient has been re-examined, and the chart reviewed, and there have been no interval changes to the documented history and physical.    The risks, benefits, and alternatives have been discussed at length, and the patient is willing to proceed.    We have been delayed until now because no c-arm was available. I communicated this to the patient this morning.  Will likely need obs overnight for pain control.   Johnny Bridge, MD

## 2013-05-13 NOTE — Anesthesia Preprocedure Evaluation (Addendum)
Anesthesia Evaluation  Patient identified by MRN, date of birth, ID band Patient awake    Reviewed: Allergy & Precautions, H&P , NPO status , Patient's Chart, lab work & pertinent test results  Airway Mallampati: I TM Distance: >3 FB Neck ROM: Full    Dental  (+) Teeth Intact, Dental Advisory Given, Edentulous Upper   Pulmonary Current Smoker,          Cardiovascular hypertension, Pt. on medications + dysrhythmias (takes Toprol daily for HR control) Supra Ventricular Tachycardia     Neuro/Psych    GI/Hepatic   Endo/Other  diabetes, Type 2, Oral Hypoglycemic Agents  Renal/GU      Musculoskeletal   Abdominal   Peds  Hematology   Anesthesia Other Findings   Reproductive/Obstetrics                         Anesthesia Physical Anesthesia Plan  ASA: II  Anesthesia Plan: General   Post-op Pain Management:    Induction: Intravenous  Airway Management Planned: Oral ETT  Additional Equipment:   Intra-op Plan:   Post-operative Plan: Extubation in OR  Informed Consent: I have reviewed the patients History and Physical, chart, labs and discussed the procedure including the risks, benefits and alternatives for the proposed anesthesia with the patient or authorized representative who has indicated his/her understanding and acceptance.     Plan Discussed with: CRNA and Surgeon  Anesthesia Plan Comments:         Anesthesia Quick Evaluation

## 2013-05-13 NOTE — Transfer of Care (Signed)
Immediate Anesthesia Transfer of Care Note  Patient: Alison Neal  Procedure(s) Performed: Procedure(s) with comments: OPEN REDUCTION INTERNAL FIXATION (ORIF) PROXIMAL HUMERUS FRACTURE (Left) - Surgeon request noon start time Biomet S3 Proximal humerus plate, McConnell Shoulder Retractor  Patient Location: PACU  Anesthesia Type:General  Level of Consciousness: awake, alert  and oriented  Airway & Oxygen Therapy: Patient Spontanous Breathing and Patient connected to nasal cannula oxygen  Post-op Assessment: Report given to PACU RN, Post -op Vital signs reviewed and stable, Patient moving all extremities and Patient able to stick tongue midline  Post vital signs: Reviewed and stable  Complications: No apparent anesthesia complications

## 2013-05-13 NOTE — Op Note (Signed)
05/12/2013 - 05/13/2013  7:25 PM  PATIENT:  Alison Neal    PRE-OPERATIVE DIAGNOSIS:  Left Proximal Humerus Fracture  POST-OPERATIVE DIAGNOSIS:  Same  PROCEDURE:  OPEN REDUCTION INTERNAL FIXATION (ORIF) PROXIMAL HUMERUS FRACTURE  SURGEON:  Johnny Bridge, MD  PHYSICIAN ASSISTANT: Joya Gaskins, OPA-C, present and scrubbed throughout the case, critical for completion in a timely fashion, and for retraction, instrumentation, and closure.  ANESTHESIA:   General  PREOPERATIVE INDICATIONS:  Alison Neal is a  58 y.o. female with a diagnosis of Left Proximal Humerus Fracture who elected for surgical management.    The risks benefits and alternatives were discussed with the patient including but not limited to the risks of nonoperative treatment, versus surgical intervention including infection, bleeding, nerve injury, malunion, nonunion, the need for revision surgery, hardware prominence, hardware failure, the need for hardware removal, blood clots, cardiopulmonary complications, conversion to arthroplasty, morbidity, mortality, among others, and they were willing to proceed.  Predicted outcome is good, although there will be at least a six to nine month expected recovery.    OPERATIVE IMPLANTS: Biomet S3 locking plate  OPERATIVE FINDINGS: Displaced proximal humerus fracture  OPERATIVE PROCEDURE: The patient was brought to the operating room and placed in the supine position. General anesthesia was administered. IV antibiotics were given. She was placed in the beach chair position. All bony prominences were padded. The upper extremity was prepped and draped in usual sterile fashion. Deltopectoral incision was performed.  I exposed the fracture site, and placed deep retractors. I did not tenotomize the biceps tendon. This was left in place. I elevated a small portion of the deltoid off of the shaft, in order to gain access for the plate. I placed supraspinatus and subscapularis stitches, and  then reduced the head onto the shaft. This was maintained in satisfactory position. The proximal shaft had an anterior fragmented portion, which I entrapped underneath the plate, but because of this I went to the next longer plate.  I applied the plate and secured it into the sliding hole first. I confirmed position of the reduction and the plate with C-arm, and I placed a total of 2 guidewires into the appropriate position in the head. I was satisfied that the plate was distal appropriately, and then secured the plate proximally with smooth pegs, taking care to prevent penetration into the arch articular surface, using C-arm, as well as manual feel using a hand drill. There were fragmented piece of cortex that were embedded within the head, and the superiormost peg would not advance, because it was stuck in a fragmented piece, and so I ended up with a short superior peg, although I had excellent purchase on all of the other pegs.  I then secured the plate distally using another cortical screw. Once complete fixation and reduction of been achieved, took final C-arm pictures, and irrigated the wounds copiously, and repaired the deltopectoral interval with Vicryl followed by Vicryl for the subcutaneous tissue with Monocryl and Steri-Strips for the skin. She was placed in a sling. She had a preoperative regional block as well. She tolerated the procedure well with no complications.

## 2013-05-13 NOTE — Discharge Instructions (Signed)
Diet: As you were doing prior to hospitalization   Shower:  May shower but keep the wounds dry, use an occlusive plastic wrap, NO SOAKING IN TUB.  If the bandage gets wet, change with a clean dry gauze.  Dressing:  You may change your dressing 3-5 days after surgery.  Then change the dressing daily with sterile gauze dressing.    There are sticky tapes (steri-strips) on your wounds and all the stitches are absorbable.  Leave the steri-strips in place when changing your dressings, they will peel off with time, usually 2-3 weeks.  Activity:  Increase activity slowly as tolerated, but follow the weight bearing instructions below.  No lifting or driving for 6 weeks.  Weight Bearing:   Sling at all times, no lifting with left arm..    To prevent constipation: you may use a stool softener such as -  Colace (over the counter) 100 mg by mouth twice a day  Drink plenty of fluids (prune juice may be helpful) and high fiber foods Miralax (over the counter) for constipation as needed.    Itching:  If you experience itching with your medications, try taking only a single pain pill, or even half a pain pill at a time.  You may take up to 10 pain pills per day, and you can also use benadryl over the counter for itching or also to help with sleep.   Precautions:  If you experience chest pain or shortness of breath - call 911 immediately for transfer to the hospital emergency department!!  If you develop a fever greater that 101 F, purulent drainage from wound, increased redness or drainage from wound, or calf pain -- Call the office at [phone]                                                Follow- Up Appointment:  Please call for an appointment to be seen in 2 weeks [place] - [phone]

## 2013-05-13 NOTE — Anesthesia Postprocedure Evaluation (Signed)
Anesthesia Post Note  Patient: Alison Neal  Procedure(s) Performed: Procedure(s) (LRB): OPEN REDUCTION INTERNAL FIXATION (ORIF) PROXIMAL HUMERUS FRACTURE (Left)  Anesthesia type: General  Patient location: PACU  Post pain: Pain level controlled and Adequate analgesia  Post assessment: Post-op Vital signs reviewed, Patient's Cardiovascular Status Stable, Respiratory Function Stable, Patent Airway and Pain level controlled  Last Vitals:  Filed Vitals:   05/13/13 2030  BP: 116/72  Pulse: 81  Temp: 36.7 C  Resp: 19    Post vital signs: Reviewed and stable  Level of consciousness: awake, alert  and oriented  Complications: No apparent anesthesia complications

## 2013-05-13 NOTE — Progress Notes (Signed)
Orthopedic Tech Progress Note Patient Details:  Alison Neal 1955-05-29 [id number]  Ortho Devices Type of Ortho Device: Arm sling Ortho Device/Splint Location: left upper extremity Ortho Device/Splint Interventions: Application   Ashok Cordia 05/13/2013, 1:06 PM

## 2013-05-14 LAB — GLUCOSE, CAPILLARY
GLUCOSE-CAPILLARY: 173 mg/dL — AB (ref 70–99)
Glucose-Capillary: 170 mg/dL — ABNORMAL HIGH (ref 70–99)
Glucose-Capillary: 139 mg/dL — ABNORMAL HIGH (ref 70–99)
Glucose-Capillary: 144 mg/dL — ABNORMAL HIGH (ref 70–99)

## 2013-05-14 MED ORDER — MEPERIDINE HCL 25 MG/ML IJ SOLN: 6.2500 mg | INTRAMUSCULAR | Status: DC | PRN

## 2013-05-14 MED ORDER — ONDANSETRON HCL 4 MG/2ML IJ SOLN: 4.0000 mg | Freq: Once | INTRAMUSCULAR | Status: AC | PRN

## 2013-05-14 MED ORDER — OXYCODONE HCL 5 MG PO TABS: 5.0000 mg | ORAL_TABLET | Freq: Once | ORAL | Status: AC | PRN

## 2013-05-14 MED ORDER — OXYCODONE HCL 5 MG/5ML PO SOLN
5.0000 mg | Freq: Once | ORAL | Status: AC | PRN
  Administered 2013-05-14: 5 mg via ORAL
  Filled 2013-05-14: qty 5

## 2013-05-14 MED ORDER — HYDROMORPHONE HCL PF 1 MG/ML IJ SOLN: 0.2500 mg | INTRAMUSCULAR | Status: DC | PRN

## 2013-05-14 NOTE — Progress Notes (Signed)
Orthopedic Tech Progress Note Patient Details:  Alison Neal [birth year] [id number] Patient unsafe to use OHF due to arm/shoulder injury Patient ID: [name], female   DOB: 01-14-1955, 58 y.o.   MRN: [id number]   Fenton Foy 05/14/2013, 10:50 AM

## 2013-05-14 NOTE — Progress Notes (Signed)
Patient ID: Alison Neal, female   DOB: 1955-02-13, 58 y.o.   MRN: [id number]     Subjective:  Patient reports pain as mild to moderate.  Patient sleeping but is easy to wake and alert.  She follows commands and is in no acute distress  Objective:   VITALS:   Filed Vitals:   05/13/13 2005 05/13/13 2015 05/13/13 2030 05/14/13 0500  BP: 124/67 111/64 116/72 98/65  Pulse: 95 105 81 100  Temp: 98.5 F (36.9 C)  98 F (36.7 C) 99.9 F (37.7 C)  TempSrc:    Oral  Resp: 16 18 19 18   Height:      Weight:      SpO2: 94% 93% 99% 90%    ABD soft Sensation intact distally Dorsiflexion/Plantar flexion intact Incision: dressing C/D/I and no drainage Full elbow, wrist, and hand motion  Lab Results  Component Value Date   WBC 18.4* 05/12/2013   HGB 14.6 05/12/2013   HCT 42.6 05/12/2013   MCV 96.2 05/12/2013   PLT 413* 05/12/2013     Assessment/Plan: 1 Day Post-Op   Active Problems:   Fracture of humerus, proximal, left, closed   Advance diet Up with therapy Plan for discharge tomorrow NWB left upper ext Sling at all times Dry dressing PRN   Joya Gaskins 05/14/2013, 7:37 AM  Seen and agree with above.   Marchia Bond, MD Cell [phone]

## 2013-05-14 NOTE — Evaluation (Signed)
Occupational Therapy Evaluation Patient Details Name: Camreigh Michie MRN: [id number] DOB: 01/03/1955 Today's Date: 05/14/2013    History of Present Illness Pt admitted after falling and sustaining Lt. proximal humeral fracture.  Underwent ORIF   Clinical Impression   Patient evaluated by Occupational Therapy with no further acute OT needs identified. All education has been completed and the patient has no further questions. All instruction provided, and pt able to return demonstration  See below for any follow-up Occupational Therapy or equipment needs. OT is signing off. Thank you for this referral.     Follow Up Recommendations  No OT follow up;Supervision - Intermittent    Equipment Recommendations  None recommended by OT    Recommendations for Other Services       Precautions / Restrictions Precautions Precautions: Shoulder Type of Shoulder Precautions: NWB; sling at all time  Shoulder Interventions: At all times Precaution Booklet Issued: Yes (comment) Precaution Comments: shoulder handout provided  Required Braces or Orthoses: Sling Restrictions Weight Bearing Restrictions: Yes LUE Weight Bearing: Non weight bearing      Mobility Bed Mobility Overal bed mobility: Modified Independent             General bed mobility comments: Pt instructed in best technique and also option for sleeping in recliner  Transfers Overall transfer level: Modified independent                    Balance Overall balance assessment: No apparent balance deficits (not formally assessed)                                          ADL Overall ADL's : Modified independent                                       General ADL Comments: Pt reports she donned her underpants and socks modified independently.  After instruction, she was able to don shirt mod I and demonstrate correct method for bathing axilla     Vision                      Perception     Praxis      Pertinent Vitals/Pain 4/10 Lt. Shoulder - see vitals flow sheet.      Hand Dominance Right   Extremity/Trunk Assessment Upper Extremity Assessment Upper Extremity Assessment: LUE deficits/detail LUE Deficits / Details: Wrist and hand WFL LUE: Unable to fully assess due to immobilization       Cervical / Trunk Assessment Cervical / Trunk Assessment: Normal   Communication Communication Communication: No difficulties   Cognition Arousal/Alertness: Awake/alert Behavior During Therapy: WFL for tasks assessed/performed Overall Cognitive Status: Within Functional Limits for tasks assessed                     General Comments       Exercises Exercises: Shoulder     Shoulder Instructions Shoulder Instructions Donning/doffing shirt without moving shoulder: Modified independent Method for sponge bathing under operated UE: Modified independent Donning/doffing sling/immobilizer: Modified independent Correct positioning of sling/immobilizer: Modified independent Pendulum exercises (written home exercise program):  (n/a) ROM for elbow, wrist and digits of operated UE: Modified independent (wrist and hand only) Sling wearing schedule (on at all times/off for ADL's): Modified independent Proper positioning  of operated UE when showering: Modified independent Positioning of UE while sleeping: Modified independent    Home Living Family/patient expects to be discharged to:: Private residence Living Arrangements: Alone Available Help at Discharge: Family;Friend(s);Available PRN/intermittently Type of Home: House             Bathroom Shower/Tub: Tub/shower unit;Curtain Shower/tub characteristics: Architectural technologist: Handicapped height     Home Equipment: Grab bars - toilet;Grab bars - tub/shower   Additional Comments: Pt has a tub seat      Prior Functioning/Environment Level of Independence: Independent             OT  Diagnosis:     OT Problem List:     OT Treatment/Interventions:      OT Goals(Current goals can be found in the care plan section)    OT Frequency:     Barriers to D/C:            Co-evaluation              End of Session    Activity Tolerance: Patient tolerated treatment well Patient left: in bed;with call bell/phone within reach;with family/visitor present   Time: 1610-9604 OT Time Calculation (min): 27 min Charges:  OT General Charges $OT Visit: 1 Procedure OT Evaluation $Initial OT Evaluation Tier I: 1 Procedure OT Treatments $Self Care/Home Management : 8-22 mins G-Codes: OT G-codes **NOT FOR INPATIENT CLASS** Functional Limitation: Self care Self Care Current Status (V4098): At least 1 percent but less than 20 percent impaired, limited or restricted Self Care Goal Status (J1914): At least 1 percent but less than 20 percent impaired, limited or restricted Self Care Discharge Status [phone]): At least 1 percent but less than 20 percent impaired, limited or restricted  Lasean Gorniak M Makailee Nudelman 05/14/2013, 1:57 PM

## 2013-05-15 ENCOUNTER — Encounter (HOSPITAL_COMMUNITY): Payer: Self-pay | Admitting: Orthopedic Surgery

## 2013-05-15 LAB — GLUCOSE, CAPILLARY: Glucose-Capillary: 156 mg/dL — ABNORMAL HIGH (ref 70–99)

## 2013-05-15 NOTE — Discharge Summary (Signed)
Physician Discharge Summary  Patient ID: Alison Neal MRN: [id number] DOB/AGE: [birth year] 60 y.o.  Admit date: 05/12/2013 Discharge date: 05/15/2013  Admission Diagnoses:  Proximal humerus fracture, left  Discharge Diagnoses:  Active Problems:   Fracture of humerus, proximal, left, closed hypokalemia, observed, and given iv k and will take po  Past Medical History  Diagnosis Date  . Hypertension   . Tachycardia   . Cancer   . Ovarian cancer     Surgeries: Procedure(s): OPEN REDUCTION INTERNAL FIXATION (ORIF) PROXIMAL HUMERUS FRACTURE on 05/12/2013 - 05/13/2013   Consultants (if any):    Discharged Condition: Improved  Hospital Course: Alison Neal is an 58 y.o. female who was admitted 05/12/2013 with a diagnosis of <principal problem not specified> and went to the operating room on 05/12/2013 - 05/13/2013 and underwent the above named procedures.    She was given perioperative antibiotics:  Anti-infectives   Start     Dose/Rate Route Frequency Ordered Stop   05/14/13 0000  ceFAZolin (ANCEF) IVPB 1 g/50 mL premix     1 g 100 mL/hr over 30 Minutes Intravenous Every 6 hours 05/13/13 2058 05/14/13 1052   05/13/13 1800  ceFAZolin (ANCEF) IVPB 2 g/50 mL premix     2 g 100 mL/hr over 30 Minutes Intravenous 30 min pre-op 05/13/13 1736 05/13/13 1803    .  She was given sequential compression devices, early ambulation  for DVT prophylaxis.  She benefited maximally from the hospital stay and there were no complications.    Recent vital signs:  Filed Vitals:   05/15/13 1344  BP: 122/75  Pulse: 81  Temp: 98.4 F (36.9 C)  Resp: 18    Recent laboratory studies:  Lab Results  Component Value Date   HGB 14.6 05/12/2013   Lab Results  Component Value Date   WBC 18.4* 05/12/2013   PLT 413* 05/12/2013   Lab Results  Component Value Date   INR 0.91 05/12/2013   Lab Results  Component Value Date   NA 139 05/12/2013   K 3.2* 05/12/2013   CL 97 05/12/2013   CO2 24  05/12/2013   BUN 10 05/12/2013   CREATININE 0.70 05/12/2013   GLUCOSE 177* 05/12/2013    Discharge Medications:     Medication List    STOP taking these medications       ibuprofen 200 MG tablet  Commonly known as:  ADVIL,MOTRIN      TAKE these medications       albuterol 108 (90 BASE) MCG/ACT inhaler  Commonly known as:  PROVENTIL HFA;VENTOLIN HFA  Inhale 2 puffs into the lungs every 6 (six) hours as needed for wheezing or shortness of breath.     amLODipine 10 MG tablet  Commonly known as:  NORVASC  Take 10 mg by mouth daily.     fluticasone 50 MCG/ACT nasal spray  Commonly known as:  FLONASE  Place 2 sprays into both nostrils daily.     gabapentin 300 MG capsule  Commonly known as:  NEURONTIN  Take 600 mg by mouth every morning.     KOMBIGLYZE XR 2.05-998 MG Tb24  Generic drug:  Saxagliptin-Metformin  Take 1 tablet by mouth 2 (two) times daily.     lisinopril 40 MG tablet  Commonly known as:  PRINIVIL,ZESTRIL  Take 20 mg by mouth daily.     methocarbamol 500 MG tablet  Commonly known as:  ROBAXIN  Take 1 tablet (500 mg total) by mouth 4 (four) times daily.  metoprolol succinate 25 MG 24 hr tablet  Commonly known as:  TOPROL-XL  Take 25 mg by mouth daily.     omeprazole 40 MG capsule  Commonly known as:  PRILOSEC  Take 40 mg by mouth daily.     oxyCODONE-acetaminophen 10-325 MG per tablet  Commonly known as:  PERCOCET  Take 1-2 tablets by mouth every 6 (six) hours as needed for pain. MAXIMUM TOTAL ACETAMINOPHEN DOSE IS 4000 MG PER DAY     promethazine 25 MG tablet  Commonly known as:  PHENERGAN  Take 1 tablet (25 mg total) by mouth every 6 (six) hours as needed for nausea or vomiting.     sennosides-docusate sodium 8.6-50 MG tablet  Commonly known as:  SENOKOT-S  Take 2 tablets by mouth daily.     venlafaxine XR 75 MG 24 hr capsule  Commonly known as:  EFFEXOR-XR  Take 225 mg by mouth daily with breakfast.        Diagnostic Studies: Dg Chest  Portable 1 View  05/12/2013   CLINICAL DATA:  Preoperative chest x-ray  EXAM: PORTABLE CHEST - 1 VIEW  COMPARISON:  None.  FINDINGS: Cardiac and mediastinal contours are within normal limits. Mild pulmonary vascular congestion without overt edema. Elevation of the left hemidiaphragm. No pneumothorax, pleural effusion or focal airspace consolidation. No suspicious pulmonary nodule or mass. Displaced fracture through the surgical neck of the left humerus.  IMPRESSION: 1. Pulmonary vascular congestion without overt edema. 2. Displaced fracture through the surgical neck of the left humerus.   Electronically Signed   By: Jacqulynn Cadet M.D.   On: 05/12/2013 16:52   Dg Shoulder Left  05/13/2013   CLINICAL DATA:  Status post fracture fixation.  EXAM: LEFT SHOULDER - 2+ VIEW; DG C-ARM 1-60 MIN  COMPARISON:  Plain films left shoulder 05/12/2013.  FINDINGS: We are provided with 2 fluoroscopic intraoperative spot views of the left shoulder. Images demonstrate plate and screws for fixation of a surgical neck fracture of the left humerus. Position and alignment are markedly improved. Hardware is intact. No acute abnormality.  IMPRESSION: ORIF surgical neck fracture left humerus.   Electronically Signed   By: Inge Rise M.D.   On: 05/13/2013 19:36   Dg Shoulder Left  05/12/2013   CLINICAL DATA:  Fall, left arm pain  EXAM: LEFT SHOULDER - 2+ VIEW  COMPARISON:  None.  FINDINGS: There is a fractures to the surgical neck of the humerus. This is a comminuted fracture with multiple fragments a primary of which represent the humeral head and shaft, with fracture extending into the greater tuberosity of the humerus and multiple smaller associated fragments. Humeral head remains in anatomic position while the shaft of the humerus is displaced completely medially and anteriorly relative to the humeral head.  IMPRESSION: Significantly displaced and comminuted fracture of the humerus.   Electronically Signed   By: Skipper Cliche M.D.   On: 05/12/2013 16:01   Dg Humerus Left  05/12/2013   CLINICAL DATA:  Fall, left arm pain  EXAM: LEFT HUMERUS - 2+ VIEW  COMPARISON:  None.  FINDINGS: There is a significantly displaced and comminuted fractures to the surgical neck of the humerus. The shaft of the humerus it is completely displaced medially relative to the humeral head.  IMPRESSION: Comminuted surgical neck humerus fracture with significant displacement.   Electronically Signed   By: Skipper Cliche M.D.   On: 05/12/2013 16:00   Dg C-arm 1-60 Min  05/13/2013   CLINICAL DATA:  Status post fracture fixation.  EXAM: LEFT SHOULDER - 2+ VIEW; DG C-ARM 1-60 MIN  COMPARISON:  Plain films left shoulder 05/12/2013.  FINDINGS: We are provided with 2 fluoroscopic intraoperative spot views of the left shoulder. Images demonstrate plate and screws for fixation of a surgical neck fracture of the left humerus. Position and alignment are markedly improved. Hardware is intact. No acute abnormality.  IMPRESSION: ORIF surgical neck fracture left humerus.   Electronically Signed   By: Inge Rise M.D.   On: 05/13/2013 19:36    Disposition: Final discharge disposition not confirmed      Discharge Instructions   Call MD / Call 911    Complete by:  As directed   If you experience chest pain or shortness of breath, CALL 911 and be transported to the hospital emergency room.  If you develope a fever above 101 F, pus (white drainage) or increased drainage or redness at the wound, or calf pain, call your surgeon's office.     Constipation Prevention    Complete by:  As directed   Drink plenty of fluids.  Prune juice may be helpful.  You may use a stool softener, such as Colace (over the counter) 100 mg twice a day.  Use MiraLax (over the counter) for constipation as needed.     Diet general    Complete by:  As directed      Discharge instructions    Complete by:  As directed   Change dressing in 3 days and reapply fresh dressing,  unless you have a splint (half cast).  If you have a splint/cast, just leave in place until your follow-up appointment.    Keep wounds dry for 3 weeks.  Leave steri-strips in place on skin.  Do not apply lotion or anything to the wound.           Follow-up Information   Follow up with Johnny Bridge, MD. Schedule an appointment as soon as possible for a visit in 2 weeks.   Specialty:  Orthopedic Surgery   Contact information:   [address] [place] [address] [phone]        Signed: Johnny Bridge 05/15/2013, 2:28 PM

## 2013-05-15 NOTE — Progress Notes (Signed)
NURSING PROGRESS NOTE  Alison Neal [id number] Discharge Data: 05/15/2013 4:04 PM Attending Provider: Johnny Bridge, MD [id number],UYQI C., MD     Juel Burrow to be D/C'd Home per MD order.  Discussed with the patient the After Visit Summary and all questions fully answered. All IV's discontinued with no bleeding noted. All belongings returned to patient for patient to take home.   Last Vital Signs:  Blood pressure 122/75, pulse 81, temperature 98.4 F (36.9 C), temperature source Oral, resp. rate 18, height 5\' 7"  (1.702 m), weight 80.151 kg (176 lb 11.2 oz), SpO2 96.00%.  Discharge Medication List   Medication List    STOP taking these medications       ibuprofen 200 MG tablet  Commonly known as:  ADVIL,MOTRIN      TAKE these medications       albuterol 108 (90 BASE) MCG/ACT inhaler  Commonly known as:  PROVENTIL HFA;VENTOLIN HFA  Inhale 2 puffs into the lungs every 6 (six) hours as needed for wheezing or shortness of breath.     amLODipine 10 MG tablet  Commonly known as:  NORVASC  Take 10 mg by mouth daily.     fluticasone 50 MCG/ACT nasal spray  Commonly known as:  FLONASE  Place 2 sprays into both nostrils daily.     gabapentin 300 MG capsule  Commonly known as:  NEURONTIN  Take 600 mg by mouth every morning.     KOMBIGLYZE XR 2.05-998 MG Tb24  Generic drug:  Saxagliptin-Metformin  Take 1 tablet by mouth 2 (two) times daily.     lisinopril 40 MG tablet  Commonly known as:  PRINIVIL,ZESTRIL  Take 20 mg by mouth daily.     methocarbamol 500 MG tablet  Commonly known as:  ROBAXIN  Take 1 tablet (500 mg total) by mouth 4 (four) times daily.     metoprolol succinate 25 MG 24 hr tablet  Commonly known as:  TOPROL-XL  Take 25 mg by mouth daily.     omeprazole 40 MG capsule  Commonly known as:  PRILOSEC  Take 40 mg by mouth daily.     oxyCODONE-acetaminophen 10-325 MG per tablet  Commonly known as:  PERCOCET  Take 1-2 tablets by mouth every 6 (six) hours  as needed for pain. MAXIMUM TOTAL ACETAMINOPHEN DOSE IS 4000 MG PER DAY     promethazine 25 MG tablet  Commonly known as:  PHENERGAN  Take 1 tablet (25 mg total) by mouth every 6 (six) hours as needed for nausea or vomiting.     sennosides-docusate sodium 8.6-50 MG tablet  Commonly known as:  SENOKOT-S  Take 2 tablets by mouth daily.     venlafaxine XR 75 MG 24 hr capsule  Commonly known as:  EFFEXOR-XR  Take 225 mg by mouth daily with breakfast.

## 2014-01-14 ENCOUNTER — Encounter (HOSPITAL_COMMUNITY): Payer: Self-pay | Admitting: Orthopedic Surgery

## 2015-05-18 IMAGING — CR DG SHOULDER 2+V*L*
1 series · 1 of 1 positions shown · non-contrast
Comparison: None.

CLINICAL DATA: Fall, left arm pain

EXAM:
LEFT SHOULDER - 2+ VIEW

[w shoulder ap external left]
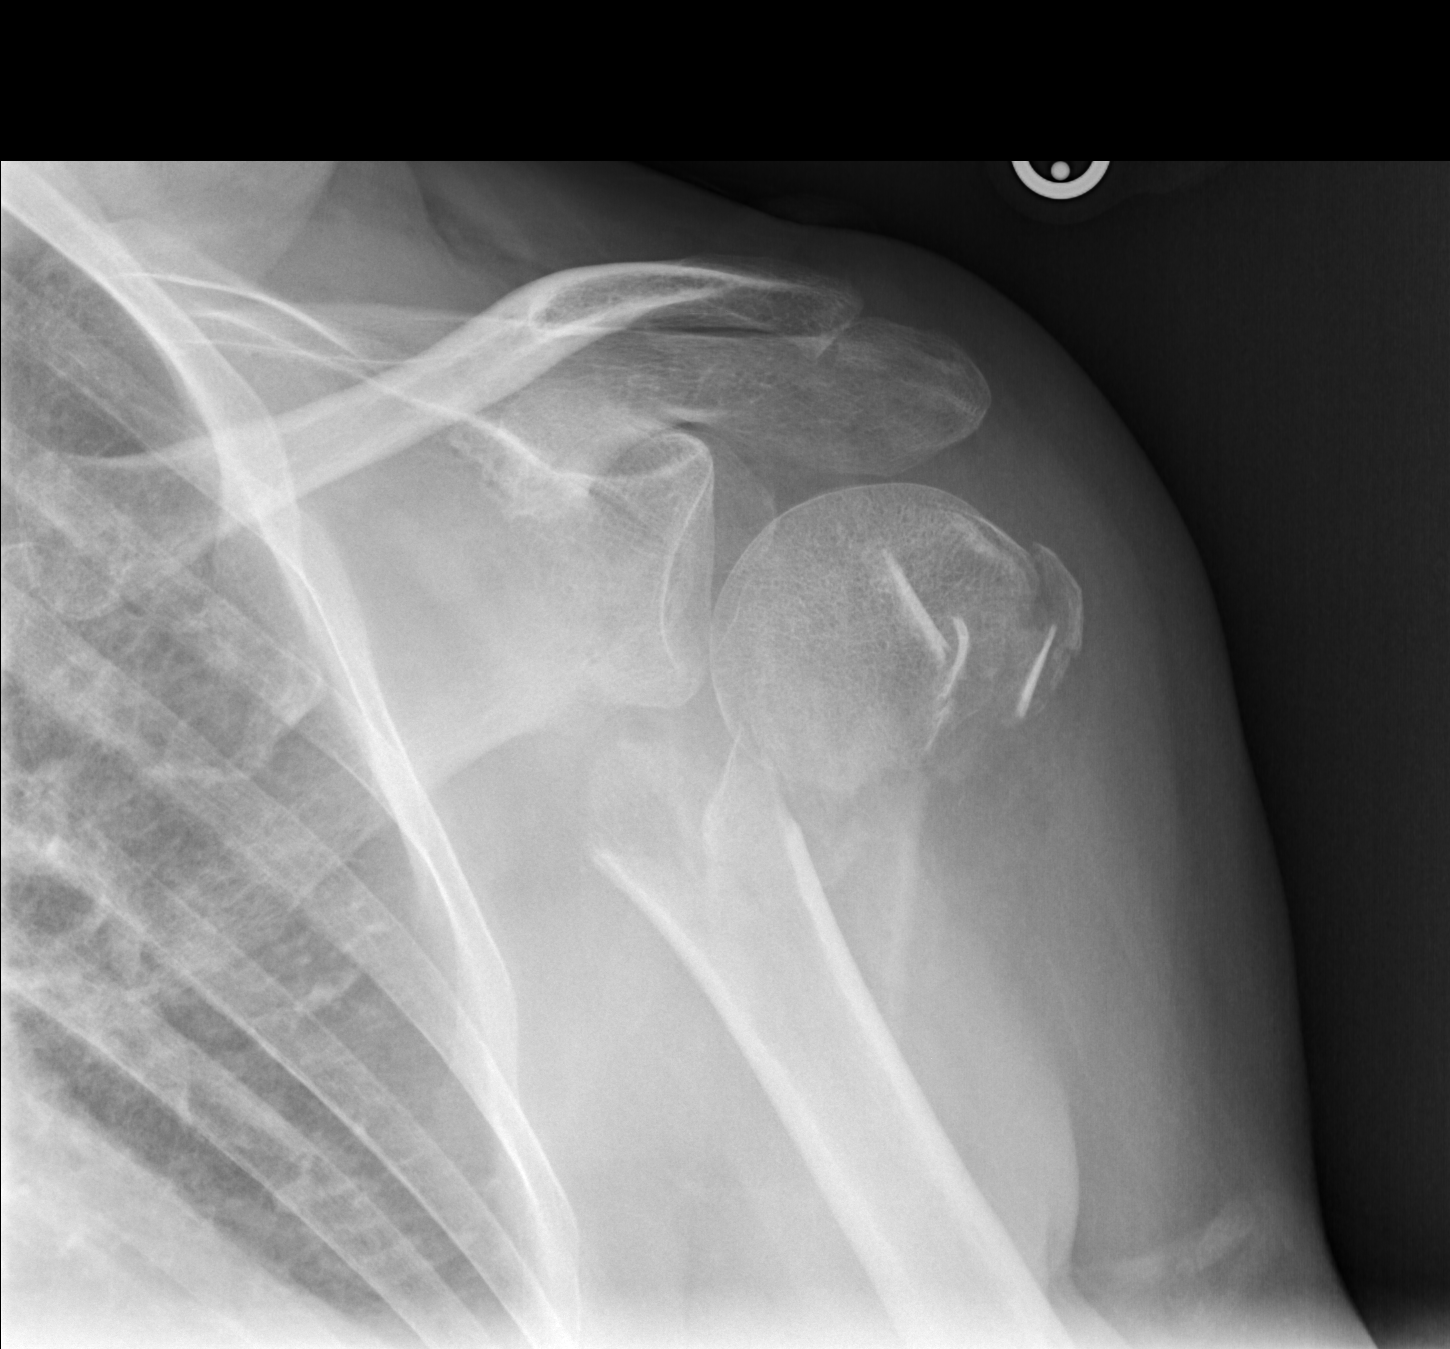

[1 of 1 positions shown; findings below may reference images not displayed]

FINDINGS: There is a fractures to the surgical neck of the humerus. This is a
comminuted fracture with multiple fragments a primary of which
represent the humeral head and shaft, with fracture extending into
the greater tuberosity of the humerus and multiple smaller
associated fragments. Humeral head remains in anatomic position
while the shaft of the humerus is displaced completely medially and
anteriorly relative to the humeral head.
IMPRESSION: Significantly displaced and comminuted fracture of the humerus.

## 2015-05-18 IMAGING — CR DG HUMERUS 2V *L*
2 series · 2 of 2 positions shown · non-contrast
Comparison: None.

CLINICAL DATA: Fall, left arm pain

EXAM:
LEFT HUMERUS - 2+ VIEW

[w humerus ap left *]
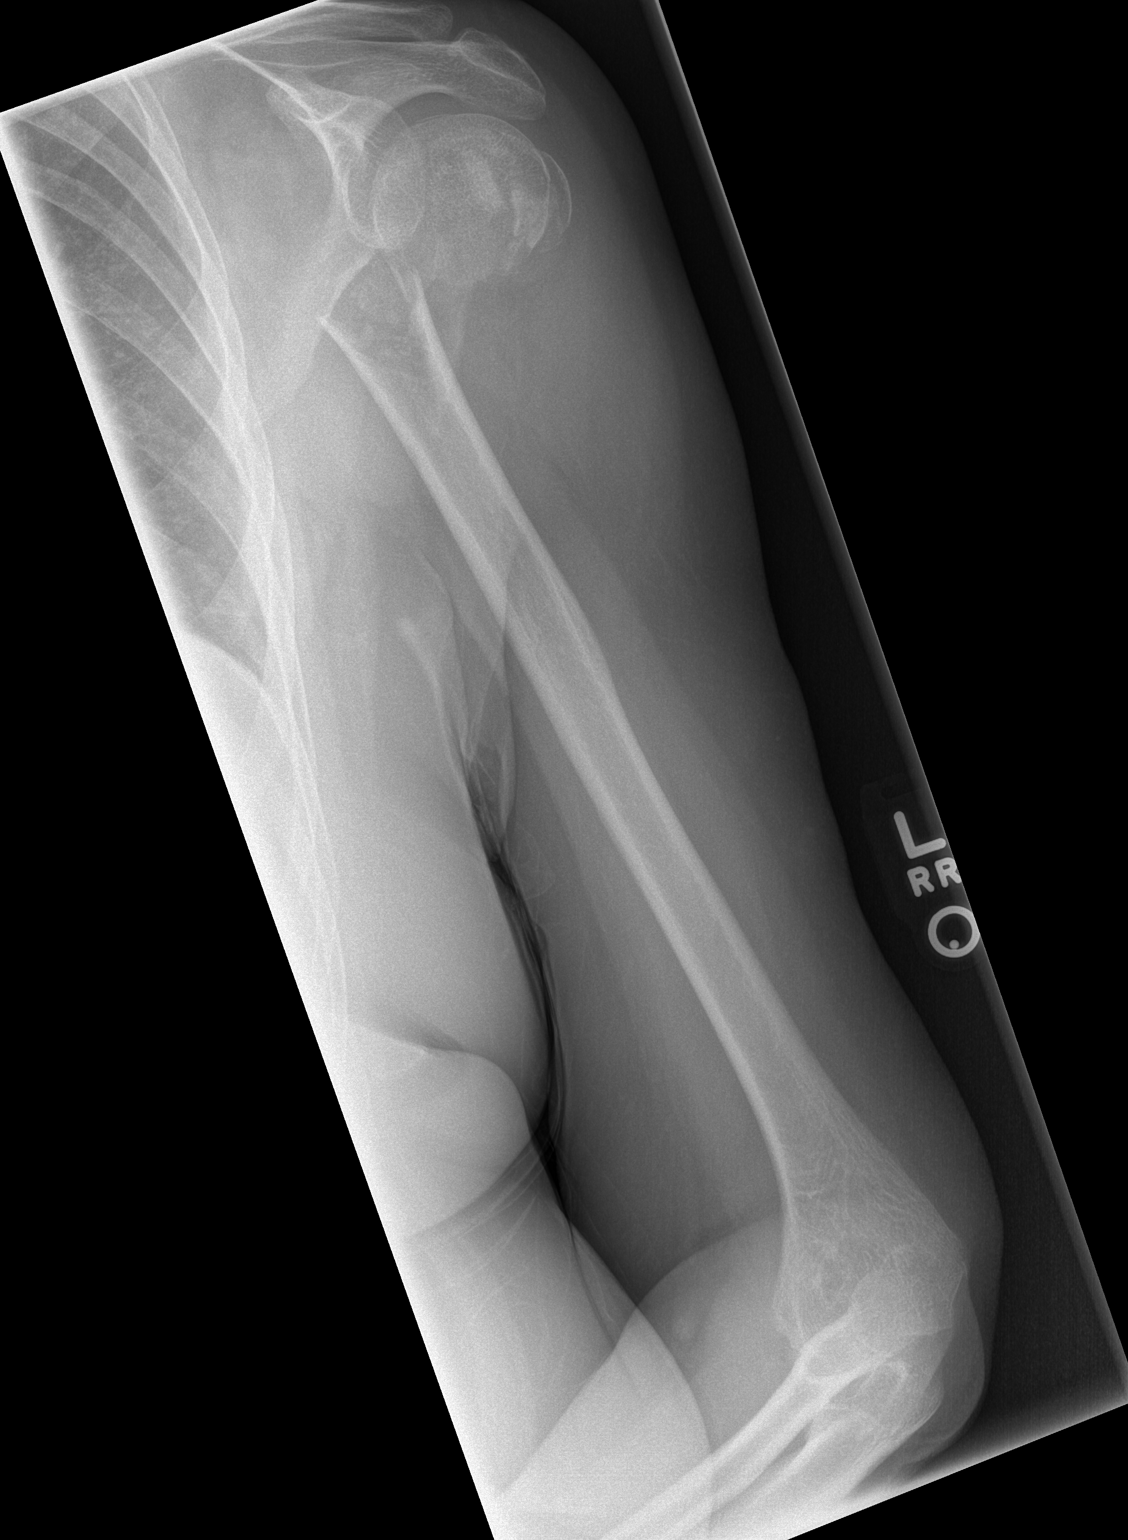

[w humerus lat left *]
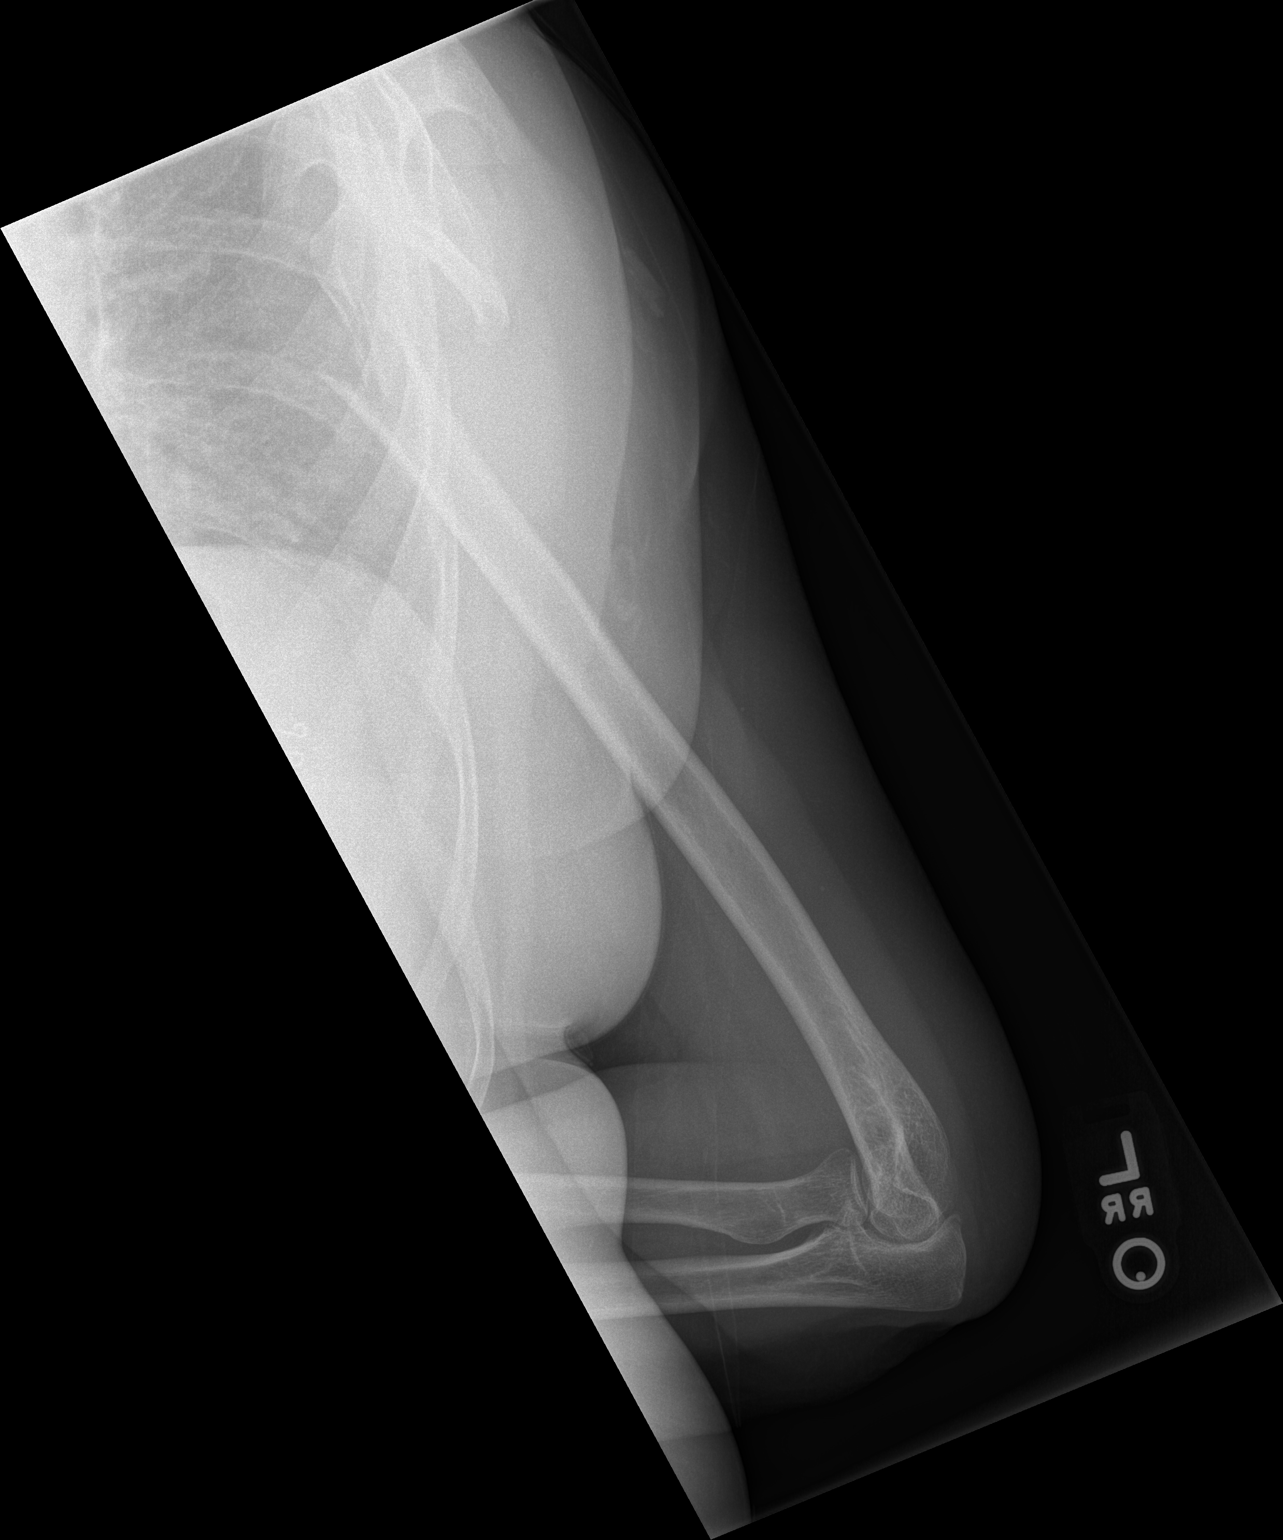

[2 of 2 positions shown; findings below may reference images not displayed]

FINDINGS: There is a significantly displaced and comminuted fractures to the
surgical neck of the humerus. The shaft of the humerus it is
completely displaced medially relative to the humeral head.
IMPRESSION: Comminuted surgical neck humerus fracture with significant
displacement.

## 2015-05-18 IMAGING — CR DG SHOULDER 2+V*L*
1 series · 1 of 1 positions shown · non-contrast
Comparison: None.

CLINICAL DATA: Fall, left arm pain

EXAM:
LEFT SHOULDER - 2+ VIEW

[w shoulder y view left]
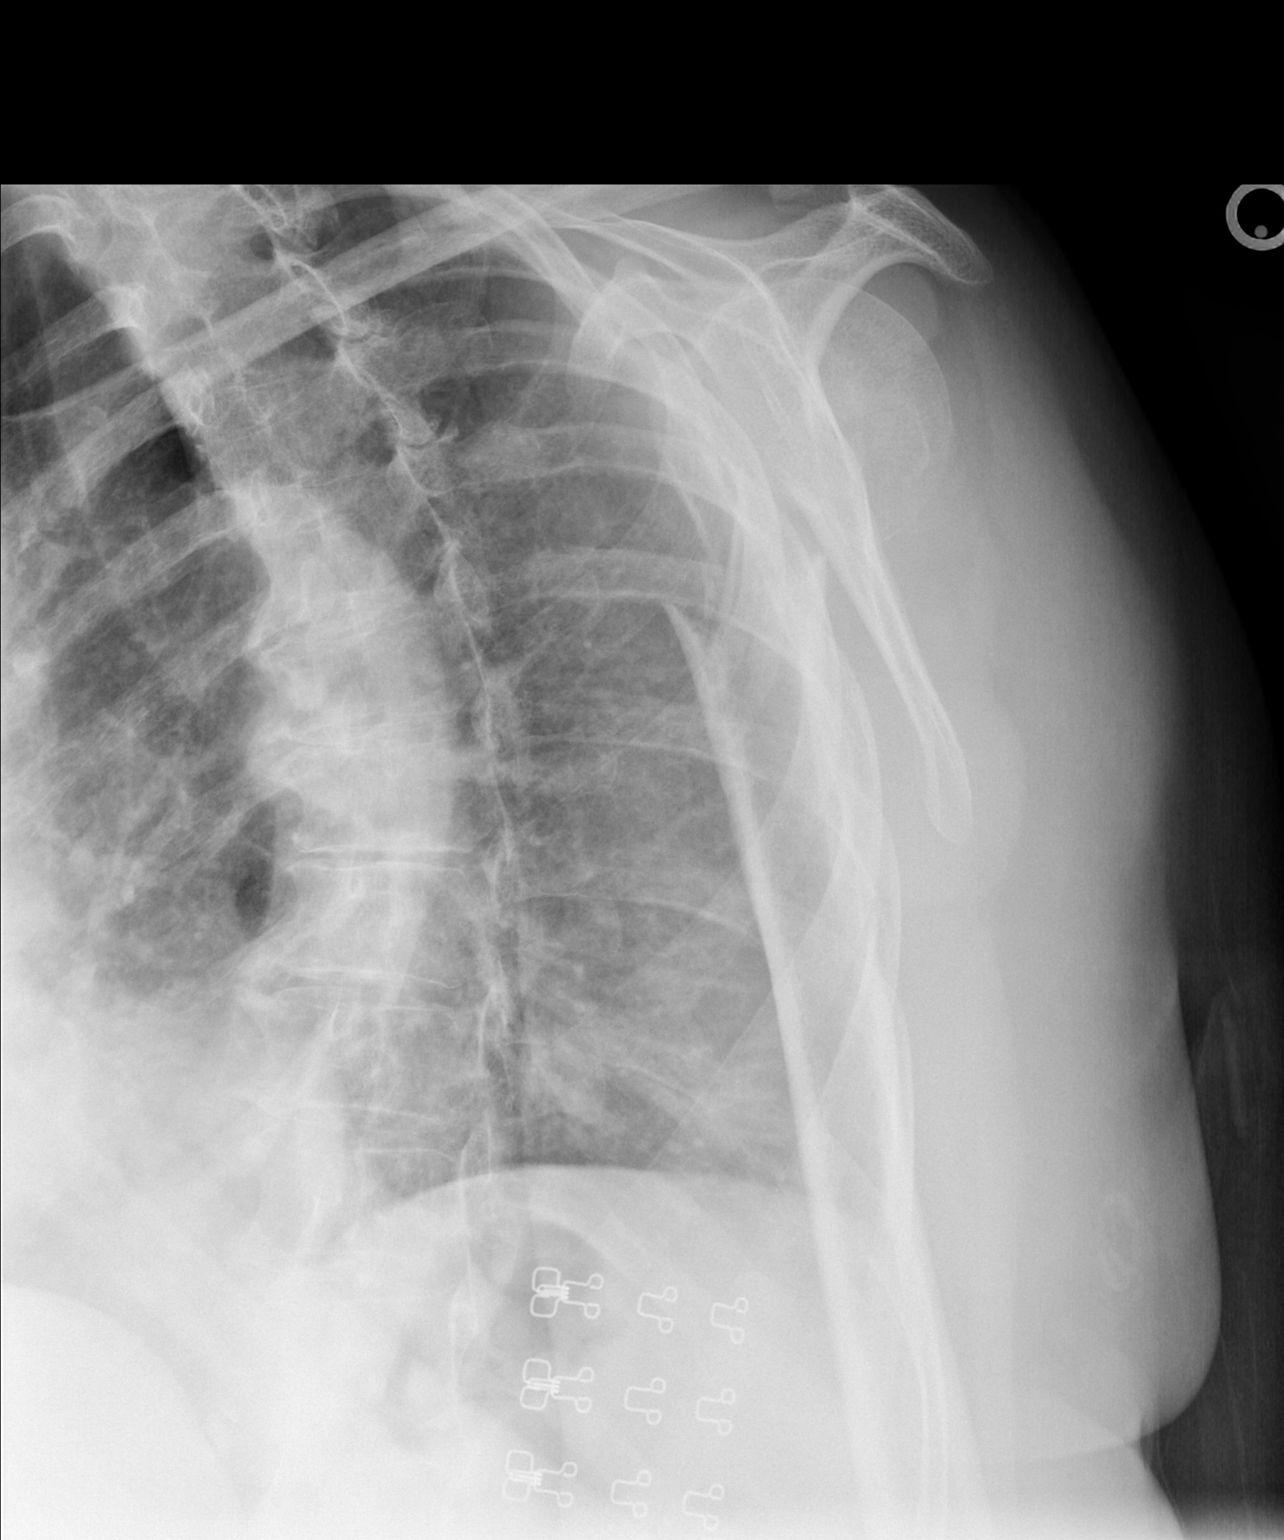

[1 of 1 positions shown; findings below may reference images not displayed]

FINDINGS: There is a fractures to the surgical neck of the humerus. This is a
comminuted fracture with multiple fragments a primary of which
represent the humeral head and shaft, with fracture extending into
the greater tuberosity of the humerus and multiple smaller
associated fragments. Humeral head remains in anatomic position
while the shaft of the humerus is displaced completely medially and
anteriorly relative to the humeral head.
IMPRESSION: Significantly displaced and comminuted fracture of the humerus.

## 2015-05-19 IMAGING — RF DG C-ARM 61-120 MIN
1 series · 2 of 2 positions shown · non-contrast
Comparison: Plain films left shoulder 05/12/2013.

CLINICAL DATA: Status post fracture fixation.

EXAM:
LEFT SHOULDER - 2+ VIEW; DG C-ARM 1-60 MIN

[Series 1: run · 2 of 2 slices shown]
[im 1/2]
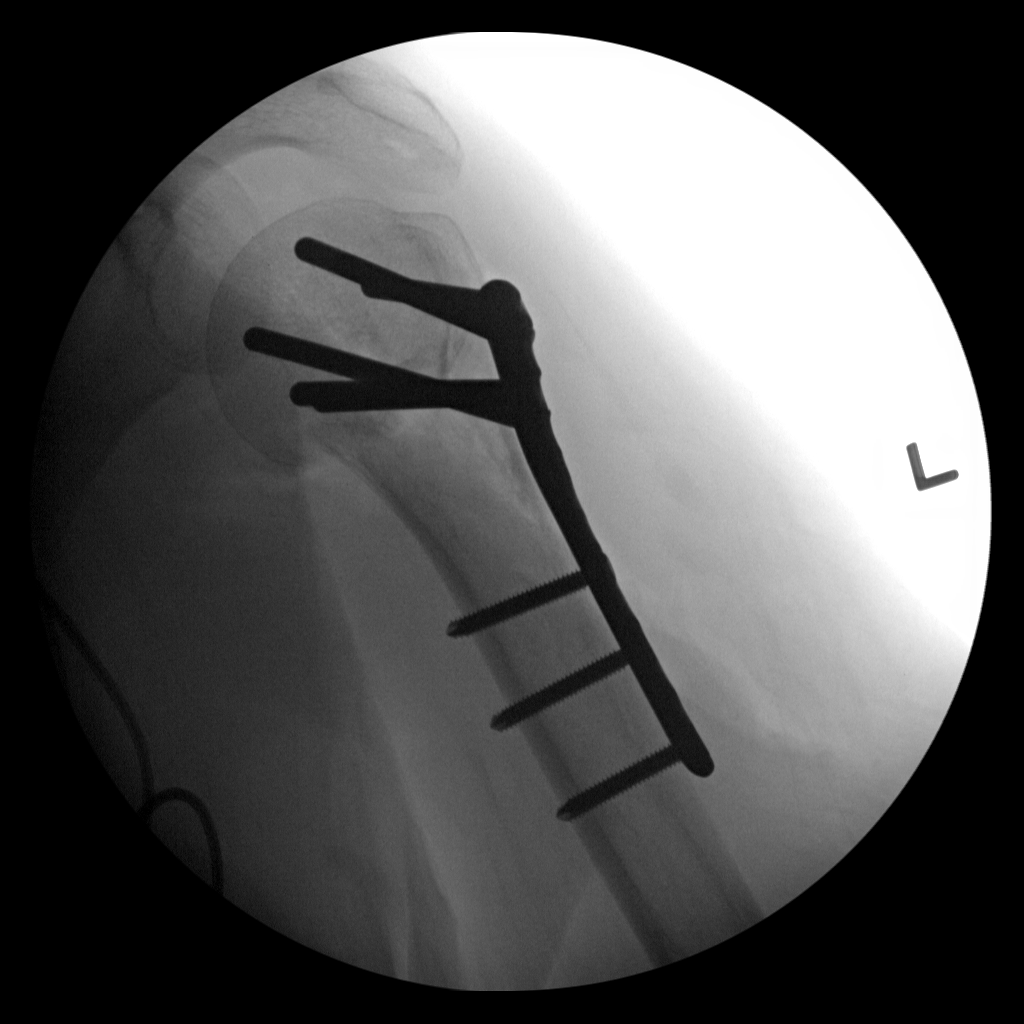
[im 2/2]
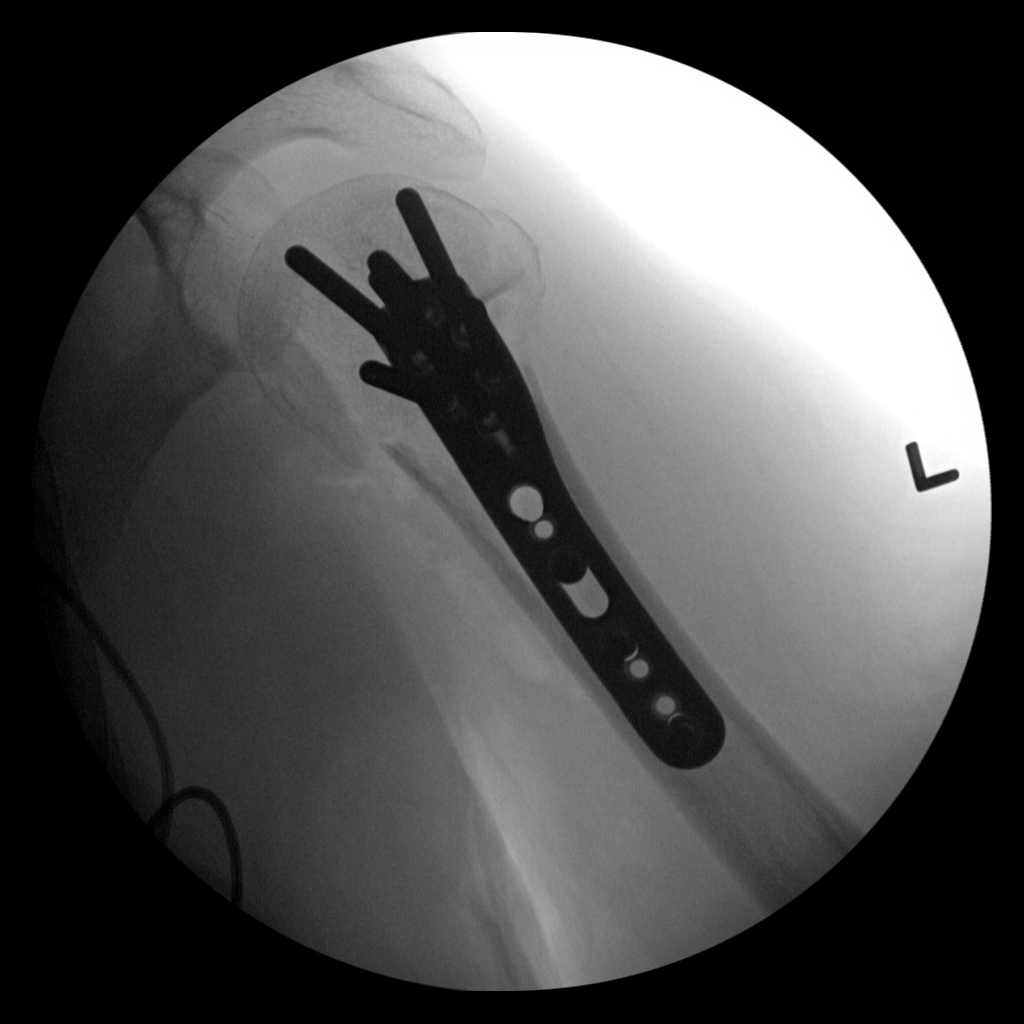

[2 of 2 positions shown; findings below may reference images not displayed]

FINDINGS: We are provided with 2 fluoroscopic intraoperative spot views of the
left shoulder. Images demonstrate plate and screws for fixation of a
surgical neck fracture of the left humerus. Position and alignment
are markedly improved. Hardware is intact. No acute abnormality.
IMPRESSION: ORIF surgical neck fracture left humerus.

## 2016-05-01 DEATH — deceased
